# Patient Record
Sex: Male | Born: 1975 | Race: White | Hispanic: No | Marital: Single
Health system: Southern US, Community
[De-identification: ages and names within clinical notes are randomized; demographics above are authoritative.]

## PROBLEM LIST (undated history)

## (undated) DIAGNOSIS — Z8669 Personal history of other diseases of the nervous system and sense organs: Secondary | ICD-10-CM

## (undated) DIAGNOSIS — F32A Depression, unspecified: Secondary | ICD-10-CM

## (undated) DIAGNOSIS — F329 Major depressive disorder, single episode, unspecified: Secondary | ICD-10-CM

## (undated) DIAGNOSIS — S069X9A Unspecified intracranial injury with loss of consciousness of unspecified duration, initial encounter: Secondary | ICD-10-CM

---

## 2006-08-07 ENCOUNTER — Ambulatory Visit: Payer: Self-pay | Admitting: Internal Medicine

## 2006-10-24 ENCOUNTER — Ambulatory Visit: Payer: Self-pay | Admitting: Internal Medicine

## 2006-10-30 ENCOUNTER — Encounter: Payer: Self-pay | Admitting: Internal Medicine

## 2006-10-30 ENCOUNTER — Ambulatory Visit: Payer: Self-pay | Admitting: Internal Medicine

## 2007-02-12 ENCOUNTER — Ambulatory Visit: Payer: Self-pay | Admitting: Internal Medicine

## 2007-04-06 ENCOUNTER — Ambulatory Visit: Payer: Self-pay | Admitting: Psychiatry

## 2007-04-06 ENCOUNTER — Emergency Department (HOSPITAL_COMMUNITY): Admission: EM | Admit: 2007-04-06 | Discharge: 2007-04-06 | Payer: Self-pay | Admitting: Emergency Medicine

## 2007-04-06 ENCOUNTER — Inpatient Hospital Stay (HOSPITAL_COMMUNITY): Admission: AD | Admit: 2007-04-06 | Discharge: 2007-04-07 | Payer: Self-pay | Admitting: Psychiatry

## 2007-04-10 ENCOUNTER — Ambulatory Visit: Payer: Self-pay | Admitting: Licensed Clinical Social Worker

## 2007-06-28 DIAGNOSIS — K2940 Chronic atrophic gastritis without bleeding: Secondary | ICD-10-CM | POA: Insufficient documentation

## 2007-06-28 DIAGNOSIS — G43909 Migraine, unspecified, not intractable, without status migrainosus: Secondary | ICD-10-CM | POA: Insufficient documentation

## 2007-06-28 DIAGNOSIS — G4700 Insomnia, unspecified: Secondary | ICD-10-CM

## 2007-06-28 DIAGNOSIS — R1110 Vomiting, unspecified: Secondary | ICD-10-CM | POA: Insufficient documentation
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
## 2011-02-06 LAB — RAPID URINE DRUG SCREEN, HOSP PERFORMED
Cocaine: NOT DETECTED
Tetrahydrocannabinol: NOT DETECTED

## 2011-02-06 LAB — DIFFERENTIAL
Basophils Relative: 0
Lymphs Abs: 1.5
Monocytes Absolute: 0.6
Monocytes Relative: 12
Neutro Abs: 2.6
Neutrophils Relative %: 52

## 2011-02-06 LAB — I-STAT 8, (EC8 V) (CONVERTED LAB)
BUN: 16
Glucose, Bld: 93
Hemoglobin: 13.9
Potassium: 4.2
Sodium: 136
pH, Ven: 7.455 — ABNORMAL HIGH

## 2011-02-06 LAB — CBC
Hemoglobin: 13.2
MCHC: 33.7
MCV: 95.3
RBC: 4.1 — ABNORMAL LOW
WBC: 5

## 2016-02-04 ENCOUNTER — Ambulatory Visit: Payer: Self-pay | Admitting: Family Medicine

## 2017-04-15 ENCOUNTER — Emergency Department (HOSPITAL_COMMUNITY): Payer: Medicaid Other | Admitting: Certified Registered"

## 2017-04-15 ENCOUNTER — Encounter (HOSPITAL_COMMUNITY): Admission: EM | Disposition: A | Discharge status: Rehabilitation Facility | Payer: Self-pay | Source: Home / Self Care

## 2017-04-15 ENCOUNTER — Emergency Department (HOSPITAL_COMMUNITY): Payer: Medicaid Other

## 2017-04-15 ENCOUNTER — Encounter (HOSPITAL_COMMUNITY): Payer: Self-pay | Admitting: *Deleted

## 2017-04-15 ENCOUNTER — Inpatient Hospital Stay (HOSPITAL_COMMUNITY)
Admission: EM | Admit: 2017-04-15 | Discharge: 2017-04-19 | DRG: 025 | Disposition: A | Discharge status: Rehabilitation Facility | Payer: Medicaid Other | Attending: General Surgery | Admitting: General Surgery

## 2017-04-15 DIAGNOSIS — J9600 Acute respiratory failure, unspecified whether with hypoxia or hypercapnia: Secondary | ICD-10-CM

## 2017-04-15 DIAGNOSIS — Z9889 Other specified postprocedural states: Secondary | ICD-10-CM

## 2017-04-15 DIAGNOSIS — Z818 Family history of other mental and behavioral disorders: Secondary | ICD-10-CM

## 2017-04-15 DIAGNOSIS — S064X9A Epidural hemorrhage with loss of consciousness of unspecified duration, initial encounter: Secondary | ICD-10-CM

## 2017-04-15 DIAGNOSIS — S065XAA Traumatic subdural hemorrhage with loss of consciousness status unknown, initial encounter: Secondary | ICD-10-CM | POA: Diagnosis present

## 2017-04-15 DIAGNOSIS — J9690 Respiratory failure, unspecified, unspecified whether with hypoxia or hypercapnia: Secondary | ICD-10-CM

## 2017-04-15 DIAGNOSIS — S0211GA Other fracture of occiput, right side, initial encounter for closed fracture: Secondary | ICD-10-CM | POA: Diagnosis present

## 2017-04-15 DIAGNOSIS — R569 Unspecified convulsions: Secondary | ICD-10-CM

## 2017-04-15 DIAGNOSIS — S0219XA Other fracture of base of skull, initial encounter for closed fracture: Secondary | ICD-10-CM | POA: Diagnosis present

## 2017-04-15 DIAGNOSIS — J9601 Acute respiratory failure with hypoxia: Secondary | ICD-10-CM | POA: Diagnosis present

## 2017-04-15 DIAGNOSIS — S2243XA Multiple fractures of ribs, bilateral, initial encounter for closed fracture: Secondary | ICD-10-CM | POA: Diagnosis present

## 2017-04-15 DIAGNOSIS — S2249XA Multiple fractures of ribs, unspecified side, initial encounter for closed fracture: Secondary | ICD-10-CM

## 2017-04-15 DIAGNOSIS — G40409 Other generalized epilepsy and epileptic syndromes, not intractable, without status epilepticus: Secondary | ICD-10-CM | POA: Diagnosis present

## 2017-04-15 DIAGNOSIS — G4700 Insomnia, unspecified: Secondary | ICD-10-CM | POA: Diagnosis present

## 2017-04-15 DIAGNOSIS — R402412 Glasgow coma scale score 13-15, at arrival to emergency department: Secondary | ICD-10-CM | POA: Diagnosis present

## 2017-04-15 DIAGNOSIS — S066X9A Traumatic subarachnoid hemorrhage with loss of consciousness of unspecified duration, initial encounter: Secondary | ICD-10-CM | POA: Diagnosis present

## 2017-04-15 DIAGNOSIS — Z87820 Personal history of traumatic brain injury: Secondary | ICD-10-CM

## 2017-04-15 DIAGNOSIS — S064XAA Epidural hemorrhage with loss of consciousness status unknown, initial encounter: Secondary | ICD-10-CM

## 2017-04-15 DIAGNOSIS — G43909 Migraine, unspecified, not intractable, without status migrainosus: Secondary | ICD-10-CM | POA: Diagnosis present

## 2017-04-15 DIAGNOSIS — S065X9A Traumatic subdural hemorrhage with loss of consciousness of unspecified duration, initial encounter: Secondary | ICD-10-CM | POA: Diagnosis present

## 2017-04-15 HISTORY — PX: CRANIOTOMY: SHX93

## 2017-04-15 HISTORY — DX: Unspecified intracranial injury with loss of consciousness of unspecified duration, initial encounter: S06.9X9A

## 2017-04-15 HISTORY — DX: Depression, unspecified: F32.A

## 2017-04-15 HISTORY — DX: Personal history of other diseases of the nervous system and sense organs: Z86.69

## 2017-04-15 HISTORY — DX: Major depressive disorder, single episode, unspecified: F32.9

## 2017-04-15 LAB — I-STAT ARTERIAL BLOOD GAS, ED
Acid-base deficit: 4 mmol/L — ABNORMAL HIGH (ref 0.0–2.0)
Bicarbonate: 22.4 mmol/L (ref 20.0–28.0)
O2 Saturation: 100 %
PCO2 ART: 44 mmHg (ref 32.0–48.0)
PH ART: 7.313 — AB (ref 7.350–7.450)
TCO2: 24 mmol/L (ref 22–32)
pO2, Arterial: 297 mmHg — ABNORMAL HIGH (ref 83.0–108.0)

## 2017-04-15 LAB — CBC
HEMATOCRIT: 39 % (ref 39.0–52.0)
Hemoglobin: 13.2 g/dL (ref 13.0–17.0)
MCH: 31.4 pg (ref 26.0–34.0)
MCHC: 33.8 g/dL (ref 30.0–36.0)
MCV: 92.6 fL (ref 78.0–100.0)
PLATELETS: 190 10*3/uL (ref 150–400)
RBC: 4.21 MIL/uL — ABNORMAL LOW (ref 4.22–5.81)
RDW: 12.9 % (ref 11.5–15.5)
WBC: 6.9 10*3/uL (ref 4.0–10.5)

## 2017-04-15 LAB — COMPREHENSIVE METABOLIC PANEL
ALT: 19 U/L (ref 17–63)
AST: 28 U/L (ref 15–41)
Albumin: 4.4 g/dL (ref 3.5–5.0)
Alkaline Phosphatase: 88 U/L (ref 38–126)
Anion gap: 9 (ref 5–15)
BILIRUBIN TOTAL: 0.6 mg/dL (ref 0.3–1.2)
BUN: 10 mg/dL (ref 6–20)
CO2: 28 mmol/L (ref 22–32)
CREATININE: 1.16 mg/dL (ref 0.61–1.24)
Calcium: 10 mg/dL (ref 8.9–10.3)
Chloride: 103 mmol/L (ref 101–111)
GFR calc Af Amer: >60 mL/min (ref >60)
Glucose, Bld: 109 mg/dL — ABNORMAL HIGH (ref 65–99)
POTASSIUM: 2.8 mmol/L — AB (ref 3.5–5.1)
Sodium: 140 mmol/L (ref 135–145)
TOTAL PROTEIN: 7 g/dL (ref 6.5–8.1)

## 2017-04-15 LAB — PREPARE RBC (CROSSMATCH)

## 2017-04-15 LAB — I-STAT CHEM 8, ED
BUN: 12 mg/dL (ref 6–20)
CREATININE: 1.1 mg/dL (ref 0.61–1.24)
Calcium, Ion: 1.25 mmol/L (ref 1.15–1.40)
Chloride: 101 mmol/L (ref 101–111)
Glucose, Bld: 110 mg/dL — ABNORMAL HIGH (ref 65–99)
HEMATOCRIT: 40 % (ref 39.0–52.0)
Hemoglobin: 13.6 g/dL (ref 13.0–17.0)
Potassium: 2.8 mmol/L — ABNORMAL LOW (ref 3.5–5.1)
Sodium: 143 mmol/L (ref 135–145)
TCO2: 28 mmol/L (ref 22–32)

## 2017-04-15 LAB — I-STAT CG4 LACTIC ACID, ED: Lactic Acid, Venous: 1.62 mmol/L (ref 0.5–1.9)

## 2017-04-15 LAB — ETHANOL

## 2017-04-15 LAB — PROTIME-INR
INR: 0.93
Prothrombin Time: 12.4 seconds (ref 11.4–15.2)

## 2017-04-15 SURGERY — CRANIOTOMY HEMATOMA EVACUATION SUBDURAL
Anesthesia: General | Site: Head | Laterality: Right

## 2017-04-15 MED ORDER — FENTANYL CITRATE (PF) 100 MCG/2ML IJ SOLN
INTRAMUSCULAR | Status: AC
  Filled 2017-04-15: qty 2

## 2017-04-15 MED ORDER — ROCURONIUM BROMIDE 50 MG/5ML IV SOLN
70.0000 mg | Freq: Once | INTRAVENOUS | Status: AC
  Administered 2017-04-15: 70 mg via INTRAVENOUS
  Filled 2017-04-15: qty 7

## 2017-04-15 MED ORDER — PROPOFOL 1000 MG/100ML IV EMUL
5.0000 ug/kg/min | Freq: Once | INTRAVENOUS | Status: AC
  Administered 2017-04-15: 25 ug/kg/min via INTRAVENOUS
  Filled 2017-04-15: qty 100

## 2017-04-15 MED ORDER — THROMBIN (RECOMBINANT) 20000 UNITS EX SOLR
CUTANEOUS | Status: AC
  Filled 2017-04-15: qty 20000

## 2017-04-15 MED ORDER — THROMBIN (RECOMBINANT) 5000 UNITS EX SOLR
CUTANEOUS | Status: AC
  Filled 2017-04-15: qty 5000

## 2017-04-15 MED ORDER — IOPAMIDOL (ISOVUE-300) INJECTION 61%
INTRAVENOUS | Status: AC
  Administered 2017-04-15: 100 mL
  Filled 2017-04-15: qty 100

## 2017-04-15 MED ORDER — FENTANYL CITRATE (PF) 100 MCG/2ML IJ SOLN
INTRAMUSCULAR | Status: AC
  Administered 2017-04-15: 100 ug via INTRAVENOUS
  Filled 2017-04-15: qty 2

## 2017-04-15 MED ORDER — SODIUM CHLORIDE 0.9 % IV SOLN
10.0000 mL/h | Freq: Once | INTRAVENOUS | Status: AC
  Administered 2017-04-15: 23:00:00 via INTRAVENOUS

## 2017-04-15 MED ORDER — TETANUS-DIPHTH-ACELL PERTUSSIS 5-2.5-18.5 LF-MCG/0.5 IM SUSP
INTRAMUSCULAR | Status: AC
  Filled 2017-04-15: qty 0.5

## 2017-04-15 MED ORDER — LORAZEPAM 2 MG/ML IJ SOLN
INTRAMUSCULAR | Status: AC
  Filled 2017-04-15: qty 1

## 2017-04-15 MED ORDER — BACITRACIN ZINC 500 UNIT/GM EX OINT
TOPICAL_OINTMENT | CUTANEOUS | Status: AC
  Filled 2017-04-15: qty 28.35

## 2017-04-15 MED ORDER — PROPOFOL 10 MG/ML IV BOLUS
INTRAVENOUS | Status: AC
  Filled 2017-04-15: qty 20

## 2017-04-15 MED ORDER — SODIUM CHLORIDE 0.9 % IV SOLN
INTRAVENOUS | Status: AC | PRN
  Administered 2017-04-15: 100 mL/h via INTRAVENOUS

## 2017-04-15 MED ORDER — MIDAZOLAM HCL 2 MG/2ML IJ SOLN
INTRAMUSCULAR | Status: AC
  Administered 2017-04-15
  Filled 2017-04-15: qty 2

## 2017-04-15 MED ORDER — SODIUM CHLORIDE 0.9 % IV SOLN
1500.0000 mg | Freq: Once | INTRAVENOUS | Status: AC
  Administered 2017-04-15: 1500 mg via INTRAVENOUS
  Filled 2017-04-15: qty 15

## 2017-04-15 MED ORDER — MIDAZOLAM HCL 2 MG/2ML IJ SOLN
2.0000 mg | Freq: Once | INTRAMUSCULAR | Status: AC
  Administered 2017-04-15: 2 mg via INTRAVENOUS

## 2017-04-15 MED ORDER — FENTANYL CITRATE (PF) 250 MCG/5ML IJ SOLN
INTRAMUSCULAR | Status: AC
  Filled 2017-04-15: qty 5

## 2017-04-15 MED ORDER — LORAZEPAM 2 MG/ML IJ SOLN
2.0000 mg | Freq: Once | INTRAMUSCULAR | Status: AC
  Administered 2017-04-15: 2 mg via INTRAVENOUS

## 2017-04-15 MED ORDER — LIDOCAINE-EPINEPHRINE 1 %-1:100000 IJ SOLN
INTRAMUSCULAR | Status: AC
  Filled 2017-04-15: qty 1

## 2017-04-15 MED ORDER — PROPOFOL 1000 MG/100ML IV EMUL
INTRAVENOUS | Status: AC
  Filled 2017-04-15: qty 100

## 2017-04-15 MED ORDER — ETOMIDATE 2 MG/ML IV SOLN
20.0000 mg | Freq: Once | INTRAVENOUS | Status: AC
  Administered 2017-04-15: 20 mg via INTRAVENOUS

## 2017-04-15 MED ORDER — FENTANYL CITRATE (PF) 100 MCG/2ML IJ SOLN
100.0000 ug | Freq: Once | INTRAMUSCULAR | Status: AC
  Administered 2017-04-15: 100 ug via INTRAVENOUS

## 2017-04-15 MED ORDER — MIDAZOLAM HCL 2 MG/2ML IJ SOLN
INTRAMUSCULAR | Status: AC
  Filled 2017-04-15: qty 2

## 2017-04-15 MED ORDER — TETANUS-DIPHTH-ACELL PERTUSSIS 5-2.5-18.5 LF-MCG/0.5 IM SUSP
0.5000 mL | Freq: Once | INTRAMUSCULAR | Status: AC
  Administered 2017-04-15: 0.5 mL via INTRAMUSCULAR

## 2017-04-15 SURGICAL SUPPLY — 62 items
BAG DECANTER FOR FLEXI CONT (MISCELLANEOUS) ×3 IMPLANT
BUR SPIRAL ROUTER 2.3 (BUR) ×2 IMPLANT
BUR SPIRAL ROUTER 2.3MM (BUR) ×1
CANISTER SUCT 3000ML PPV (MISCELLANEOUS) ×6 IMPLANT
CARTRIDGE OIL MAESTRO DRILL (MISCELLANEOUS) ×1 IMPLANT
CLIP VESOCCLUDE MED 6/CT (CLIP) IMPLANT
DIFFUSER DRILL AIR PNEUMATIC (MISCELLANEOUS) ×3 IMPLANT
DRAPE MICROSCOPE LEICA (MISCELLANEOUS) IMPLANT
DRAPE NEUROLOGICAL W/INCISE (DRAPES) ×3 IMPLANT
DRAPE SURG 17X23 STRL (DRAPES) IMPLANT
DRAPE WARM FLUID 44X44 (DRAPE) ×3 IMPLANT
DURAPREP 26ML APPLICATOR (WOUND CARE) ×3 IMPLANT
DURAPREP 6ML APPLICATOR 50/CS (WOUND CARE) ×3 IMPLANT
ELECT CAUTERY BLADE 6.4 (BLADE) IMPLANT
ELECT REM PT RETURN 9FT ADLT (ELECTROSURGICAL) ×3
ELECTRODE REM PT RTRN 9FT ADLT (ELECTROSURGICAL) ×1 IMPLANT
EVACUATOR 1/8 PVC DRAIN (DRAIN) IMPLANT
GAUZE SPONGE 4X4 12PLY STRL (GAUZE/BANDAGES/DRESSINGS) ×3 IMPLANT
GAUZE SPONGE 4X4 12PLY STRL LF (GAUZE/BANDAGES/DRESSINGS) ×3 IMPLANT
GAUZE SPONGE 4X4 16PLY XRAY LF (GAUZE/BANDAGES/DRESSINGS) IMPLANT
GLOVE BIO SURGEON STRL SZ7 (GLOVE) ×3 IMPLANT
GLOVE BIO SURGEON STRL SZ8 (GLOVE) ×3 IMPLANT
GLOVE BIOGEL PI IND STRL 7.0 (GLOVE) ×2 IMPLANT
GLOVE BIOGEL PI INDICATOR 7.0 (GLOVE) ×4
GLOVE SURG SS PI 6.5 STRL IVOR (GLOVE) ×9 IMPLANT
GOWN STRL REUS W/ TWL LRG LVL3 (GOWN DISPOSABLE) ×2 IMPLANT
GOWN STRL REUS W/ TWL XL LVL3 (GOWN DISPOSABLE) ×1 IMPLANT
GOWN STRL REUS W/TWL 2XL LVL3 (GOWN DISPOSABLE) ×3 IMPLANT
GOWN STRL REUS W/TWL LRG LVL3 (GOWN DISPOSABLE) ×4
GOWN STRL REUS W/TWL XL LVL3 (GOWN DISPOSABLE) ×2
HEMOSTAT POWDER KIT SURGIFOAM (HEMOSTASIS) ×3 IMPLANT
KIT BASIN OR (CUSTOM PROCEDURE TRAY) ×3 IMPLANT
KIT ROOM TURNOVER OR (KITS) ×3 IMPLANT
NEEDLE HYPO 22GX1.5 SAFETY (NEEDLE) ×3 IMPLANT
NS IRRIG 1000ML POUR BTL (IV SOLUTION) ×6 IMPLANT
OIL CARTRIDGE MAESTRO DRILL (MISCELLANEOUS) ×3
PACK CRANIOTOMY (CUSTOM PROCEDURE TRAY) ×3 IMPLANT
PAD ARMBOARD 7.5X6 YLW CONV (MISCELLANEOUS) ×6 IMPLANT
PATTIES SURGICAL .25X.25 (GAUZE/BANDAGES/DRESSINGS) IMPLANT
PATTIES SURGICAL .5 X.5 (GAUZE/BANDAGES/DRESSINGS) IMPLANT
PATTIES SURGICAL .5 X3 (DISPOSABLE) IMPLANT
PATTIES SURGICAL 1X1 (DISPOSABLE) IMPLANT
PERFORATOR LRG  14-11MM (BIT) ×2
PERFORATOR LRG 14-11MM (BIT) ×1 IMPLANT
PIN MAYFIELD SKULL DISP (PIN) ×3 IMPLANT
PLATE 1.5  2HOLE LNG NEURO (Plate) ×8 IMPLANT
PLATE 1.5 2HOLE LNG NEURO (Plate) ×4 IMPLANT
RUBBERBAND STERILE (MISCELLANEOUS) IMPLANT
SCREW SELF DRILL HT 1.5/4MM (Screw) ×24 IMPLANT
SPONGE NEURO XRAY DETECT 1X3 (DISPOSABLE) IMPLANT
SPONGE SURGIFOAM ABS GEL 100 (HEMOSTASIS) ×3 IMPLANT
STAPLER VISISTAT 35W (STAPLE) ×3 IMPLANT
SUT ETHILON 3 0 FSL (SUTURE) IMPLANT
SUT NURALON 4 0 TR CR/8 (SUTURE) ×6 IMPLANT
SUT VIC AB 2-0 CP2 18 (SUTURE) ×6 IMPLANT
SYR CONTROL 10ML LL (SYRINGE) IMPLANT
TAPE CLOTH SURG 4X10 WHT LF (GAUZE/BANDAGES/DRESSINGS) ×3 IMPLANT
TOWEL GREEN STERILE (TOWEL DISPOSABLE) ×3 IMPLANT
TOWEL GREEN STERILE FF (TOWEL DISPOSABLE) ×3 IMPLANT
TRAY FOLEY W/METER SILVER 16FR (SET/KITS/TRAYS/PACK) IMPLANT
UNDERPAD 30X30 (UNDERPADS AND DIAPERS) IMPLANT
WATER STERILE IRR 1000ML POUR (IV SOLUTION) ×3 IMPLANT
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
## 2017-04-15 NOTE — H&P (Signed)
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 penis normal.  Musculoskeletal: He exhibits no edema or deformity.  Neurological: He is unresponsive. He exhibits normal muscle tone.  No posturing  Skin: Skin is warm and dry.  Psychiatric:  Unable to examine due to intubation/sedation     Assessment/Injury Summary: -Right temporal epidural hematoma with midline shift 40m -Complex right temporal bone fx -Small amount of SAH - skullbase -Small amount of hemorrhage in L frontal and temporal lobes -Right occipital bone fx  PLAN -Taken to OR emergently by neurosurgery -ICU as per neurosurgery -Will ultimately need ENT evaluation given the complex temporal bone fx -Pulmonary toilet for aspiration  CSharon Mt WDema Severin M.D. [facility] 04/16/2017, 1:04 AM
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
## 2017-04-15 NOTE — Anesthesia Preprocedure Evaluation (Signed)
Anesthesia Evaluation  Patient identified by MRN, date of birth, ID band Patient unresponsive  Preop documentation limited or incomplete due to emergent nature of procedure.  Airway Mallampati: Intubated       Dental   Pulmonary    Pulmonary exam normal        Cardiovascular Normal cardiovascular exam     Neuro/Psych Subdural Hematoma    GI/Hepatic   Endo/Other    Renal/GU      Musculoskeletal   Abdominal   Peds  Hematology   Anesthesia Other Findings   Reproductive/Obstetrics                             Anesthesia Physical Anesthesia Plan  ASA: IV and emergent  Anesthesia Plan: General   Post-op Pain Management:    Induction: Intravenous  PONV Risk Score and Plan: 2 and Treatment may vary due to age or medical condition  Airway Management Planned: Oral ETT  Additional Equipment:   Intra-op Plan:   Post-operative Plan: Post-operative intubation/ventilation  Informed Consent: I have reviewed the patients History and Physical, chart, labs and discussed the procedure including the risks, benefits and alternatives for the proposed anesthesia with the patient or authorized representative who has indicated his/her understanding and acceptance.     Plan Discussed with: CRNA and Surgeon  Anesthesia Plan Comments:         Anesthesia Quick Evaluation

## 2017-04-16 ENCOUNTER — Inpatient Hospital Stay (HOSPITAL_COMMUNITY): Payer: Medicaid Other

## 2017-04-16 ENCOUNTER — Other Ambulatory Visit: Payer: Self-pay

## 2017-04-16 ENCOUNTER — Encounter (HOSPITAL_COMMUNITY): Payer: Self-pay | Admitting: Anesthesiology

## 2017-04-16 DIAGNOSIS — S2243XA Multiple fractures of ribs, bilateral, initial encounter for closed fracture: Secondary | ICD-10-CM | POA: Diagnosis present

## 2017-04-16 DIAGNOSIS — Z87820 Personal history of traumatic brain injury: Secondary | ICD-10-CM | POA: Diagnosis not present

## 2017-04-16 DIAGNOSIS — R402412 Glasgow coma scale score 13-15, at arrival to emergency department: Secondary | ICD-10-CM | POA: Diagnosis present

## 2017-04-16 DIAGNOSIS — G4700 Insomnia, unspecified: Secondary | ICD-10-CM | POA: Diagnosis present

## 2017-04-16 DIAGNOSIS — S064X9A Epidural hemorrhage with loss of consciousness of unspecified duration, initial encounter: Secondary | ICD-10-CM | POA: Diagnosis not present

## 2017-04-16 DIAGNOSIS — J9601 Acute respiratory failure with hypoxia: Secondary | ICD-10-CM | POA: Diagnosis present

## 2017-04-16 DIAGNOSIS — Z9889 Other specified postprocedural states: Secondary | ICD-10-CM

## 2017-04-16 DIAGNOSIS — S0211GA Other fracture of occiput, right side, initial encounter for closed fracture: Secondary | ICD-10-CM | POA: Diagnosis present

## 2017-04-16 DIAGNOSIS — S064X9D Epidural hemorrhage with loss of consciousness of unspecified duration, subsequent encounter: Secondary | ICD-10-CM | POA: Diagnosis not present

## 2017-04-16 DIAGNOSIS — S065X9A Traumatic subdural hemorrhage with loss of consciousness of unspecified duration, initial encounter: Secondary | ICD-10-CM | POA: Diagnosis present

## 2017-04-16 DIAGNOSIS — G43909 Migraine, unspecified, not intractable, without status migrainosus: Secondary | ICD-10-CM | POA: Diagnosis present

## 2017-04-16 DIAGNOSIS — Z818 Family history of other mental and behavioral disorders: Secondary | ICD-10-CM | POA: Diagnosis not present

## 2017-04-16 DIAGNOSIS — S065XAA Traumatic subdural hemorrhage with loss of consciousness status unknown, initial encounter: Secondary | ICD-10-CM | POA: Diagnosis present

## 2017-04-16 DIAGNOSIS — R569 Unspecified convulsions: Secondary | ICD-10-CM | POA: Diagnosis present

## 2017-04-16 DIAGNOSIS — S0219XA Other fracture of base of skull, initial encounter for closed fracture: Secondary | ICD-10-CM | POA: Diagnosis present

## 2017-04-16 DIAGNOSIS — S066X9A Traumatic subarachnoid hemorrhage with loss of consciousness of unspecified duration, initial encounter: Secondary | ICD-10-CM | POA: Diagnosis present

## 2017-04-16 DIAGNOSIS — G40409 Other generalized epilepsy and epileptic syndromes, not intractable, without status epilepticus: Secondary | ICD-10-CM | POA: Diagnosis present

## 2017-04-16 LAB — BLOOD GAS, ARTERIAL
Acid-Base Excess: 1.8 mmol/L (ref 0.0–2.0)
Bicarbonate: 25.1 mmol/L (ref 20.0–28.0)
DRAWN BY: 252031
FIO2: 40
O2 SAT: 98.6 %
PATIENT TEMPERATURE: 98.6
PCO2 ART: 34.1 mmHg (ref 32.0–48.0)
PEEP/CPAP: 5 cmH2O
PH ART: 7.479 — AB (ref 7.350–7.450)
RATE: 18 resp/min
VT: 600 mL
pO2, Arterial: 142 mmHg — ABNORMAL HIGH (ref 83.0–108.0)

## 2017-04-16 LAB — POCT I-STAT 7, (LYTES, BLD GAS, ICA,H+H)
Acid-Base Excess: 1 mmol/L (ref 0.0–2.0)
BICARBONATE: 27.2 mmol/L (ref 20.0–28.0)
Calcium, Ion: 1.25 mmol/L (ref 1.15–1.40)
HCT: 36 % — ABNORMAL LOW (ref 39.0–52.0)
Hemoglobin: 12.2 g/dL — ABNORMAL LOW (ref 13.0–17.0)
O2 Saturation: 100 %
PCO2 ART: 50.5 mmHg — AB (ref 32.0–48.0)
Potassium: 2.9 mmol/L — ABNORMAL LOW (ref 3.5–5.1)
Sodium: 140 mmol/L (ref 135–145)
TCO2: 29 mmol/L (ref 22–32)
pH, Arterial: 7.34 — ABNORMAL LOW (ref 7.350–7.450)
pO2, Arterial: 322 mmHg — ABNORMAL HIGH (ref 83.0–108.0)

## 2017-04-16 LAB — CBC
HEMATOCRIT: 34.8 % — AB (ref 39.0–52.0)
HEMOGLOBIN: 12 g/dL — AB (ref 13.0–17.0)
MCH: 31.8 pg (ref 26.0–34.0)
MCHC: 34.5 g/dL (ref 30.0–36.0)
MCV: 92.3 fL (ref 78.0–100.0)
Platelets: 180 10*3/uL (ref 150–400)
RBC: 3.77 MIL/uL — ABNORMAL LOW (ref 4.22–5.81)
RDW: 13 % (ref 11.5–15.5)
WBC: 12.5 10*3/uL — AB (ref 4.0–10.5)

## 2017-04-16 LAB — BPAM FFP
BLOOD PRODUCT EXPIRATION DATE: 201812202359
Blood Product Expiration Date: 201812202359
ISSUE DATE / TIME: 201812162206
ISSUE DATE / TIME: 201812162206
UNIT TYPE AND RH: 600
UNIT TYPE AND RH: 6200

## 2017-04-16 LAB — COMPREHENSIVE METABOLIC PANEL
ALBUMIN: 3.7 g/dL (ref 3.5–5.0)
ALK PHOS: 75 U/L (ref 38–126)
ALT: 20 U/L (ref 17–63)
AST: 31 U/L (ref 15–41)
Anion gap: 8 (ref 5–15)
BILIRUBIN TOTAL: 0.7 mg/dL (ref 0.3–1.2)
BUN: 9 mg/dL (ref 6–20)
CALCIUM: 9 mg/dL (ref 8.9–10.3)
CO2: 25 mmol/L (ref 22–32)
Chloride: 105 mmol/L (ref 101–111)
Creatinine, Ser: 1.01 mg/dL (ref 0.61–1.24)
GFR calc Af Amer: >60 mL/min (ref >60)
GFR calc non Af Amer: >60 mL/min (ref >60)
GLUCOSE: 122 mg/dL — AB (ref 65–99)
Potassium: 3.5 mmol/L (ref 3.5–5.1)
Sodium: 138 mmol/L (ref 135–145)
TOTAL PROTEIN: 5.9 g/dL — AB (ref 6.5–8.1)

## 2017-04-16 LAB — URINALYSIS, ROUTINE W REFLEX MICROSCOPIC
Bacteria, UA: NONE SEEN
Bilirubin Urine: NEGATIVE
GLUCOSE, UA: NEGATIVE mg/dL
Hgb urine dipstick: NEGATIVE
Ketones, ur: NEGATIVE mg/dL
Leukocytes, UA: NEGATIVE
NITRITE: NEGATIVE
PH: 5 (ref 5.0–8.0)
Protein, ur: 30 mg/dL — AB
Squamous Epithelial / LPF: NONE SEEN

## 2017-04-16 LAB — PREPARE FRESH FROZEN PLASMA
UNIT DIVISION: 0
Unit division: 0

## 2017-04-16 LAB — MRSA PCR SCREENING: MRSA by PCR: NEGATIVE

## 2017-04-16 LAB — BLOOD PRODUCT ORDER (VERBAL) VERIFICATION

## 2017-04-16 LAB — ABO/RH: ABO/RH(D): O POS

## 2017-04-16 LAB — HIV ANTIBODY (ROUTINE TESTING W REFLEX): HIV SCREEN 4TH GENERATION: NONREACTIVE

## 2017-04-16 LAB — CDS SEROLOGY

## 2017-04-16 LAB — TRIGLYCERIDES: Triglycerides: 51 mg/dL (ref <150)

## 2017-04-16 LAB — GLUCOSE, CAPILLARY: Glucose-Capillary: 100 mg/dL — ABNORMAL HIGH (ref 65–99)

## 2017-04-16 MED ORDER — CIPROFLOXACIN-DEXAMETHASONE 0.3-0.1 % OT SUSP
4.0000 [drp] | Freq: Two times a day (BID) | OTIC | Status: DC
  Administered 2017-04-16 – 2017-04-19 (×6): 4 [drp] via OTIC
  Filled 2017-04-16 (×2): qty 7.5

## 2017-04-16 MED ORDER — CEFAZOLIN SODIUM-DEXTROSE 1-4 GM/50ML-% IV SOLN
1.0000 g | Freq: Three times a day (TID) | INTRAVENOUS | Status: AC
  Administered 2017-04-16 (×2): 1 g via INTRAVENOUS
  Filled 2017-04-16 (×2): qty 50

## 2017-04-16 MED ORDER — PANTOPRAZOLE SODIUM 40 MG IV SOLR
40.0000 mg | Freq: Every day | INTRAVENOUS | Status: DC
  Administered 2017-04-16 – 2017-04-18 (×3): 40 mg via INTRAVENOUS
  Filled 2017-04-16 (×4): qty 40

## 2017-04-16 MED ORDER — ACETAMINOPHEN 650 MG RE SUPP
650.0000 mg | RECTAL | Status: DC | PRN
Start: 2017-04-16 — End: 2017-04-19
  Administered 2017-04-16 – 2017-04-17 (×3): 650 mg via RECTAL
  Filled 2017-04-16 (×3): qty 1

## 2017-04-16 MED ORDER — BACITRACIN 50000 UNITS IM SOLR
INTRAMUSCULAR | Status: DC | PRN
  Administered 2017-04-16: 500 mL

## 2017-04-16 MED ORDER — ONDANSETRON HCL 4 MG/2ML IJ SOLN: 4.0000 mg | Freq: Four times a day (QID) | INTRAMUSCULAR | Status: DC | PRN

## 2017-04-16 MED ORDER — ONDANSETRON 4 MG PO TBDP: 4.0000 mg | ORAL_TABLET | Freq: Four times a day (QID) | ORAL | Status: DC | PRN

## 2017-04-16 MED ORDER — PANTOPRAZOLE SODIUM 40 MG PO TBEC
40.0000 mg | DELAYED_RELEASE_TABLET | Freq: Every day | ORAL | Status: DC
  Administered 2017-04-19: 40 mg via ORAL
  Filled 2017-04-16: qty 1

## 2017-04-16 MED ORDER — PROPOFOL 500 MG/50ML IV EMUL
INTRAVENOUS | Status: DC | PRN
  Administered 2017-04-16: 75 ug/kg/min via INTRAVENOUS

## 2017-04-16 MED ORDER — DEXTROSE 5 % IV SOLN
INTRAVENOUS | Status: DC | PRN
  Administered 2017-04-16: 50 ug/min via INTRAVENOUS

## 2017-04-16 MED ORDER — LACTATED RINGERS IV SOLN
INTRAVENOUS | Status: DC
  Administered 2017-04-16 (×2): via INTRAVENOUS

## 2017-04-16 MED ORDER — LEVETIRACETAM 500 MG/5ML IV SOLN
500.0000 mg | Freq: Two times a day (BID) | INTRAVENOUS | Status: DC
  Administered 2017-04-16 – 2017-04-19 (×7): 500 mg via INTRAVENOUS
  Filled 2017-04-16 (×8): qty 5

## 2017-04-16 MED ORDER — GELATIN ABSORBABLE MT POWD
OROMUCOSAL | Status: DC | PRN
  Administered 2017-04-16 (×2): 5 mL via TOPICAL

## 2017-04-16 MED ORDER — HYDROCODONE-ACETAMINOPHEN 5-325 MG PO TABS
1.0000 | ORAL_TABLET | ORAL | Status: DC | PRN
  Administered 2017-04-18 – 2017-04-19 (×2): 1 via ORAL
  Filled 2017-04-16 (×2): qty 1

## 2017-04-16 MED ORDER — SENNA 8.6 MG PO TABS
1.0000 | ORAL_TABLET | Freq: Two times a day (BID) | ORAL | Status: DC
  Administered 2017-04-17 – 2017-04-19 (×2): 8.6 mg via ORAL
  Filled 2017-04-16 (×3): qty 1

## 2017-04-16 MED ORDER — ACETAMINOPHEN 325 MG PO TABS: 650.0000 mg | ORAL_TABLET | ORAL | Status: DC | PRN

## 2017-04-16 MED ORDER — PROPOFOL 10 MG/ML IV BOLUS
INTRAVENOUS | Status: DC | PRN
  Administered 2017-04-16: 140 mg via INTRAVENOUS

## 2017-04-16 MED ORDER — CEFAZOLIN SODIUM 1 G IJ SOLR
INTRAMUSCULAR | Status: AC
  Filled 2017-04-16: qty 20

## 2017-04-16 MED ORDER — DEXMEDETOMIDINE HCL IN NACL 200 MCG/50ML IV SOLN
0.4000 ug/kg/h | INTRAVENOUS | Status: AC
  Administered 2017-04-16: 1 ug/kg/h via INTRAVENOUS
  Administered 2017-04-16: 1.2 ug/kg/h via INTRAVENOUS
  Administered 2017-04-16: 0.4 ug/kg/h via INTRAVENOUS
  Filled 2017-04-16 (×4): qty 50

## 2017-04-16 MED ORDER — CHLORHEXIDINE GLUCONATE 0.12% ORAL RINSE (MEDLINE KIT)
15.0000 mL | Freq: Two times a day (BID) | OROMUCOSAL | Status: DC
  Administered 2017-04-16 (×2): 15 mL via OROMUCOSAL

## 2017-04-16 MED ORDER — LACTATED RINGERS IV SOLN
INTRAVENOUS | Status: DC | PRN
  Administered 2017-04-16: via INTRAVENOUS

## 2017-04-16 MED ORDER — FENTANYL CITRATE (PF) 250 MCG/5ML IJ SOLN
INTRAMUSCULAR | Status: AC
  Filled 2017-04-16: qty 5

## 2017-04-16 MED ORDER — PROPOFOL 1000 MG/100ML IV EMUL
5.0000 ug/kg/min | INTRAVENOUS | Status: DC
  Administered 2017-04-16: 65 ug/kg/min via INTRAVENOUS
  Administered 2017-04-16: 75 ug/kg/min via INTRAVENOUS
  Administered 2017-04-16: 80 ug/kg/min via INTRAVENOUS
  Administered 2017-04-16: 75 ug/kg/min via INTRAVENOUS
  Filled 2017-04-16 (×2): qty 100

## 2017-04-16 MED ORDER — ORAL CARE MOUTH RINSE: 15.0000 mL | Freq: Four times a day (QID) | OROMUCOSAL | Status: DC

## 2017-04-16 MED ORDER — FENTANYL CITRATE (PF) 100 MCG/2ML IJ SOLN
50.0000 ug | INTRAMUSCULAR | Status: DC | PRN
  Administered 2017-04-16 – 2017-04-18 (×7): 100 ug via INTRAVENOUS
  Filled 2017-04-16 (×7): qty 2

## 2017-04-16 MED ORDER — LABETALOL HCL 5 MG/ML IV SOLN: 10.0000 mg | INTRAVENOUS | Status: DC | PRN

## 2017-04-16 MED ORDER — BACITRACIN ZINC 500 UNIT/GM EX OINT
TOPICAL_OINTMENT | CUTANEOUS | Status: DC | PRN
  Administered 2017-04-16: 1 via TOPICAL

## 2017-04-16 MED ORDER — SODIUM CHLORIDE 0.9 % IV SOLN
0.4000 ug/kg/h | INTRAVENOUS | Status: DC
  Administered 2017-04-16: 1.2 ug/kg/h via INTRAVENOUS
  Administered 2017-04-17: 1 ug/kg/h via INTRAVENOUS
  Administered 2017-04-17: 0.8 ug/kg/h via INTRAVENOUS
  Administered 2017-04-17 – 2017-04-18 (×3): 1.2 ug/kg/h via INTRAVENOUS
  Administered 2017-04-18: 0.6 ug/kg/h via INTRAVENOUS
  Administered 2017-04-18: 1.2 ug/kg/h via INTRAVENOUS
  Administered 2017-04-18: 0.4 ug/kg/h via INTRAVENOUS
  Filled 2017-04-16 (×9): qty 4

## 2017-04-16 MED ORDER — ONDANSETRON HCL 4 MG PO TABS: 4.0000 mg | ORAL_TABLET | ORAL | Status: DC | PRN

## 2017-04-16 MED ORDER — MORPHINE SULFATE (PF) 4 MG/ML IV SOLN
1.0000 mg | INTRAVENOUS | Status: DC | PRN
  Administered 2017-04-16 – 2017-04-19 (×9): 2 mg via INTRAVENOUS
  Filled 2017-04-16 (×10): qty 1

## 2017-04-16 MED ORDER — ROCURONIUM BROMIDE 100 MG/10ML IV SOLN
INTRAVENOUS | Status: DC | PRN
  Administered 2017-04-16: 30 mg via INTRAVENOUS
  Administered 2017-04-16: 20 mg via INTRAVENOUS
  Administered 2017-04-16: 50 mg via INTRAVENOUS

## 2017-04-16 MED ORDER — FENTANYL CITRATE (PF) 100 MCG/2ML IJ SOLN
INTRAMUSCULAR | Status: DC | PRN
  Administered 2017-04-15: 100 ug via INTRAVENOUS
  Administered 2017-04-16 (×2): 50 ug via INTRAVENOUS
  Administered 2017-04-16: 100 ug via INTRAVENOUS
  Administered 2017-04-16 (×2): 50 ug via INTRAVENOUS

## 2017-04-16 MED ORDER — METOPROLOL TARTRATE 5 MG/5ML IV SOLN: 5.0000 mg | Freq: Four times a day (QID) | INTRAVENOUS | Status: DC | PRN

## 2017-04-16 MED ORDER — PHENYLEPHRINE HCL 10 MG/ML IJ SOLN
INTRAMUSCULAR | Status: DC | PRN
  Administered 2017-04-16: 40 ug via INTRAVENOUS

## 2017-04-16 MED ORDER — 0.9 % SODIUM CHLORIDE (POUR BTL) OPTIME
TOPICAL | Status: DC | PRN
  Administered 2017-04-16: 20000 mL

## 2017-04-16 MED ORDER — ONDANSETRON HCL 4 MG/2ML IJ SOLN: 4.0000 mg | INTRAMUSCULAR | Status: DC | PRN

## 2017-04-16 MED ORDER — ORAL CARE MOUTH RINSE
15.0000 mL | OROMUCOSAL | Status: DC
  Administered 2017-04-16 (×7): 15 mL via OROMUCOSAL

## 2017-04-16 MED ORDER — POTASSIUM CHLORIDE IN NACL 20-0.9 MEQ/L-% IV SOLN
INTRAVENOUS | Status: DC
  Administered 2017-04-16 – 2017-04-17 (×4): via INTRAVENOUS
  Filled 2017-04-16 (×6): qty 1000

## 2017-04-16 MED ORDER — LIDOCAINE-EPINEPHRINE 1 %-1:100000 IJ SOLN
INTRAMUSCULAR | Status: DC | PRN
  Administered 2017-04-16: 7 mL

## 2017-04-16 MED ORDER — CHLORHEXIDINE GLUCONATE 0.12% ORAL RINSE (MEDLINE KIT): 15.0000 mL | Freq: Two times a day (BID) | OROMUCOSAL | Status: DC

## 2017-04-16 MED ORDER — THROMBIN (RECOMBINANT) 20000 UNITS EX SOLR
CUTANEOUS | Status: DC | PRN
  Administered 2017-04-16: 20 mL via TOPICAL

## 2017-04-16 MED ORDER — DOCUSATE SODIUM 100 MG PO CAPS
100.0000 mg | ORAL_CAPSULE | Freq: Two times a day (BID) | ORAL | Status: DC
  Administered 2017-04-17 – 2017-04-19 (×2): 100 mg via ORAL
  Filled 2017-04-16 (×3): qty 1

## 2017-04-16 MED ORDER — PANTOPRAZOLE SODIUM 40 MG IV SOLR: 40.0000 mg | Freq: Every day | INTRAVENOUS | Status: DC

## 2017-04-16 MED ORDER — THROMBIN (RECOMBINANT) 5000 UNITS EX SOLR
CUTANEOUS | Status: AC
  Filled 2017-04-16: qty 5000

## 2017-04-16 MED ORDER — POTASSIUM CHLORIDE 10 MEQ/100ML IV SOLN
10.0000 meq | INTRAVENOUS | Status: AC
  Administered 2017-04-16 (×3): 10 meq via INTRAVENOUS
  Filled 2017-04-16 (×3): qty 100

## 2017-04-16 MED ORDER — CEFAZOLIN SODIUM-DEXTROSE 2-3 GM-%(50ML) IV SOLR
INTRAVENOUS | Status: DC | PRN
  Administered 2017-04-16: 2 g via INTRAVENOUS

## 2017-04-16 NOTE — ED Notes (Signed)
Patient became agitated and tried to come up off stretcher.  VO from Dr Cliffton AstersWhite for 2mg  Versed and 100mcg of fentanyl.
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
## 2017-04-16 NOTE — Progress Notes (Signed)
OT Cancellation Note  Patient Details Name: Thomas BlakeShawn Mcfarland MRN: [id number] DOB: 10/23/1975   Cancelled Treatment:    Reason Eval/Treat Not Completed: Patient not medically ready. Pt with active bedrest orders. Will await increase in activity orders prior to initiating OT evaluation. Thank you for this referral!  Doristine Sectionharity A Hillis Mcphatter, MS OTR/L  Pager: [phone]   Spring[facility] Surgery CenterCharity A Kham Zuckerman 04/16/2017, 6:48 AM

## 2017-04-16 NOTE — Progress Notes (Signed)
PT Cancellation Note  Patient Details Name: Thomas Mcfarland MRN: [id number] DOB: Jun 04, 1975   Cancelled Treatment:    Reason Eval/Treat Not Completed: Medical issues which prohibited therapy(pt currently on bedrest )   Thomas Mcfarland 04/16/2017, 7:25 AM  Thomas Mcfarland, PT [phone]
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
## 2017-04-16 NOTE — Anesthesia Procedure Notes (Signed)
Arterial Line Insertion Start/End12/17/2018 12:07 AM, 04/16/2017 12:09 AM Performed by: Arta Brucessey, Kevin, MD, Maness, Howie Illarrie B, CRNA, CRNA  Patient location: OR. Preanesthetic checklist: patient identified, IV checked, pre-op evaluation and timeout performed Left, radial was placed Catheter size: 20 G Maximum sterile barriers used   Attempts: 1 Procedure performed without using ultrasound guided technique. Ultrasound Notes:anatomy identified Following insertion, dressing applied and Biopatch.

## 2017-04-16 NOTE — Anesthesia Postprocedure Evaluation (Signed)
Anesthesia Post Note  Patient: Thomas BlakeShawn Mcfarland  Procedure(s) Performed: CRANIOTOMY HEMATOMA EVACUATION SUBDURAL (Right Head)     Patient location during evaluation: SICU Anesthesia Type: General Level of consciousness: sedated Pain management: pain level controlled Vital Signs Assessment: post-procedure vital signs reviewed and stable Respiratory status: patient remains intubated per anesthesia plan Cardiovascular status: stable Postop Assessment: no apparent nausea or vomiting Anesthetic complications: no    Last Vitals:  Vitals:   04/15/17 2335 04/16/17 0205  BP: (!) 126/91   Pulse: 83   Resp: 18   Temp:    SpO2: 100% 99%    Last Pain:  Vitals:   04/15/17 2202  TempSrc: Temporal                 Lucillia Corson DAVID

## 2017-04-16 NOTE — ED Notes (Signed)
To CT with RN, RT and MD. 
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
## 2017-04-16 NOTE — Progress Notes (Signed)
Pt transported to and from 4N26 to CT1 on ventilator. Pt stable throughout with no complications. VS within normal limits.
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
## 2017-04-16 NOTE — Procedures (Signed)
Extubation Procedure Note  Patient Details:   Name: Thomas BlakeShawn Cali DOB: 04/18/1976 MRN: [id number]   Airway Documentation:     Evaluation  O2 sats: stable throughout Complications: No apparent complications Patient did tolerate procedure well. Bilateral Breath Sounds: Rhonchi, Diminished   Yes   Pt extubated to to 3L [place] per MD order. Pt very agitated at this time and not following commands but ok to Extubate per Dr Janee Mornhompson. Pt unable to speak or cough post extubation, however, VS within normal limits. RT will continue to closely monitor pt  Carolan ShiverKelley, Abiageal Blowe M 04/16/2017, 10:49 AM

## 2017-04-16 NOTE — Transfer of Care (Signed)
Immediate Anesthesia Transfer of Care Note  Patient: Thomas Mcfarland  Procedure(s) Performed: CRANIOTOMY HEMATOMA EVACUATION SUBDURAL (Right Head)  Patient Location: ICU  Anesthesia Type:General  Level of Consciousness: sedated and Patient remains intubated per anesthesia plan  Airway & Oxygen Therapy: Patient remains intubated per anesthesia plan and Patient placed on Ventilator (see vital sign flow sheet for setting)  Post-op Assessment: Report given to RN and Post -op Vital signs reviewed and stable  Post vital signs: Reviewed and stable  Last Vitals:  Vitals:   04/15/17 2332 04/15/17 2335  BP:  (!) 126/91  Pulse: 84 83  Resp: (!) 24 18  Temp:    SpO2: 100% 100%    Last Pain:  Vitals:   04/15/17 2202  TempSrc: Temporal         Complications: No apparent anesthesia complications

## 2017-04-17 ENCOUNTER — Encounter (HOSPITAL_COMMUNITY): Payer: Self-pay | Admitting: Physical Medicine and Rehabilitation

## 2017-04-17 ENCOUNTER — Inpatient Hospital Stay (HOSPITAL_COMMUNITY): Payer: Medicaid Other

## 2017-04-17 DIAGNOSIS — S069X3S Unspecified intracranial injury with loss of consciousness of 1 hour to 5 hours 59 minutes, sequela: Secondary | ICD-10-CM

## 2017-04-17 LAB — BASIC METABOLIC PANEL
ANION GAP: 7 (ref 5–15)
BUN: 8 mg/dL (ref 6–20)
CO2: 24 mmol/L (ref 22–32)
Calcium: 9.1 mg/dL (ref 8.9–10.3)
Chloride: 102 mmol/L (ref 101–111)
Creatinine, Ser: 0.69 mg/dL (ref 0.61–1.24)
GLUCOSE: 129 mg/dL — AB (ref 65–99)
POTASSIUM: 3.6 mmol/L (ref 3.5–5.1)
Sodium: 133 mmol/L — ABNORMAL LOW (ref 135–145)

## 2017-04-17 LAB — BPAM RBC
BLOOD PRODUCT EXPIRATION DATE: 201812262359
Blood Product Expiration Date: 201812222359
Blood Product Expiration Date: 201901012359
Blood Product Expiration Date: 201901072359
Blood Product Expiration Date: 201901092359
Blood Product Expiration Date: 201901092359
ISSUE DATE / TIME: 201812172201
ISSUE DATE / TIME: 201812170214
ISSUE DATE / TIME: 201812170214
ISSUE DATE / TIME: 201812171520
UNIT TYPE AND RH: 5100
UNIT TYPE AND RH: 5100
UNIT TYPE AND RH: 9500
Unit Type and Rh: 5100
Unit Type and Rh: 5100
Unit Type and Rh: 9500

## 2017-04-17 LAB — TYPE AND SCREEN
ABO/RH(D): O POS
Antibody Screen: NEGATIVE
UNIT DIVISION: 0
UNIT DIVISION: 0
UNIT DIVISION: 0
UNIT DIVISION: 0
Unit division: 0
Unit division: 0

## 2017-04-17 LAB — CBC
HEMATOCRIT: 31 % — AB (ref 39.0–52.0)
HEMOGLOBIN: 10.5 g/dL — AB (ref 13.0–17.0)
MCH: 31 pg (ref 26.0–34.0)
MCHC: 33.9 g/dL (ref 30.0–36.0)
MCV: 91.4 fL (ref 78.0–100.0)
Platelets: 135 10*3/uL — ABNORMAL LOW (ref 150–400)
RBC: 3.39 MIL/uL — AB (ref 4.22–5.81)
RDW: 12.8 % (ref 11.5–15.5)
WBC: 9.4 10*3/uL (ref 4.0–10.5)

## 2017-04-17 MED ORDER — ORAL CARE MOUTH RINSE
15.0000 mL | Freq: Two times a day (BID) | OROMUCOSAL | Status: DC
  Administered 2017-04-19: 15 mL via OROMUCOSAL

## 2017-04-17 MED ORDER — RESOURCE THICKENUP CLEAR PO POWD
ORAL | Status: DC | PRN
  Filled 2017-04-17: qty 125

## 2017-04-17 NOTE — Progress Notes (Signed)
PT Cancellation Note  Patient Details Name: Charlett BlakeShawn Fontaine MRN: [id number] DOB: November 15, 1975   Cancelled Treatment:    Reason Eval/Treat Not Completed: Medical issues which prohibited therapy(continue to await increased activity order from bedrest)   Kaelyn Innocent B Murvin Gift 04/17/2017, 7:15 AM  Delaney MeigsMaija Tabor Lorence Nagengast, PT [phone]
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
## 2017-04-17 NOTE — Progress Notes (Signed)
I will follow up with patient and family tomorrow to discuss goals and expectations of an inpt rehab admission. Venue determination to be determined after that discussion. Noted use of precedex continues. [phone]
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
## 2017-04-17 NOTE — Plan of Care (Signed)
Patient started on Dys 2, honey thick diet today.  Poor appetite.  Foley discontinued and post-removal void accomplished. Patient continues to be agitated when awake and needing Precedex to keep calm.
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
## 2017-04-17 NOTE — Progress Notes (Signed)
Rehab Admissions Coordinator Note:  Patient was screened by Trish MageLogue, Ata Pecha M for appropriateness for an Inpatient Acute Rehab Consult.  At this time, we are recommending Inpatient Rehab consult.  Trish MageLogue, Fusaye Wachtel M 04/17/2017, 12:24 PM  I can be reached at [phone].
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
## 2017-04-18 LAB — BASIC METABOLIC PANEL
ANION GAP: 9 (ref 5–15)
BUN: 9 mg/dL (ref 6–20)
CALCIUM: 9.5 mg/dL (ref 8.9–10.3)
CO2: 22 mmol/L (ref 22–32)
Chloride: 108 mmol/L (ref 101–111)
Creatinine, Ser: 0.71 mg/dL (ref 0.61–1.24)
GLUCOSE: 122 mg/dL — AB (ref 65–99)
POTASSIUM: 3.5 mmol/L (ref 3.5–5.1)
SODIUM: 139 mmol/L (ref 135–145)

## 2017-04-18 MED ORDER — BUTALBITAL-APAP-CAFFEINE 50-325-40 MG PO TABS
2.0000 | ORAL_TABLET | ORAL | Status: DC | PRN
  Administered 2017-04-18 – 2017-04-19 (×3): 2 via ORAL
  Filled 2017-04-18 (×3): qty 2

## 2017-04-18 MED ORDER — TRAMADOL HCL 50 MG PO TABS
50.0000 mg | ORAL_TABLET | Freq: Four times a day (QID) | ORAL | Status: DC
  Administered 2017-04-18 – 2017-04-19 (×5): 50 mg via ORAL
  Filled 2017-04-18 (×5): qty 1

## 2017-04-18 MED ORDER — OXYCODONE HCL 5 MG PO TABS
5.0000 mg | ORAL_TABLET | ORAL | Status: DC | PRN
  Administered 2017-04-19 (×2): 5 mg via ORAL
  Filled 2017-04-18 (×2): qty 1

## 2017-04-18 MED ORDER — FENTANYL CITRATE (PF) 100 MCG/2ML IJ SOLN
25.0000 ug | INTRAMUSCULAR | Status: DC | PRN
  Administered 2017-04-18 – 2017-04-19 (×12): 50 ug via INTRAVENOUS
  Filled 2017-04-18 (×12): qty 2
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
## 2017-04-18 NOTE — Clinical Social Work Note (Signed)
Clinical Social Worker unable to complete SBIRT with patient due to current cognitive status.  Patient working with therapies and plans to admit to inpatient rehab once Precedex drip is no longer present.  CSW to re-evaluate if patient mental status clears before discharge.  Macario GoldsJesse Mysti Haley, KentuckyLCSW [phone]
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
## 2017-04-18 NOTE — Progress Notes (Signed)
I met with pt's girlfriend at bedside as patient working with physical therapy. I then contacted his Dad, Keenan Bachelor, by phone to discuss goals and expectations of an inpt rehab admission. His parents can provide 24/7 assist in his care. Discussed with RN and pt weaning off precedex. I await medical readiness to admit pt to inpt rehab. Hopefully in the next 24 to 48 hrs. I will follow. [phone]

## 2017-04-19 ENCOUNTER — Inpatient Hospital Stay (HOSPITAL_COMMUNITY)
Admission: RE | Admit: 2017-04-19 | Discharge: 2017-04-24 | DRG: 945 | Disposition: A | Discharge status: Home Health | Payer: Medicaid Other | Source: Intra-hospital | Attending: Physical Medicine & Rehabilitation | Admitting: Physical Medicine & Rehabilitation

## 2017-04-19 DIAGNOSIS — S0219XD Other fracture of base of skull, subsequent encounter for fracture with routine healing: Secondary | ICD-10-CM

## 2017-04-19 DIAGNOSIS — Z818 Family history of other mental and behavioral disorders: Secondary | ICD-10-CM

## 2017-04-19 DIAGNOSIS — G44329 Chronic post-traumatic headache, not intractable: Secondary | ICD-10-CM

## 2017-04-19 DIAGNOSIS — R569 Unspecified convulsions: Secondary | ICD-10-CM | POA: Diagnosis present

## 2017-04-19 DIAGNOSIS — E871 Hypo-osmolality and hyponatremia: Secondary | ICD-10-CM | POA: Diagnosis present

## 2017-04-19 DIAGNOSIS — F329 Major depressive disorder, single episode, unspecified: Secondary | ICD-10-CM | POA: Diagnosis present

## 2017-04-19 DIAGNOSIS — E876 Hypokalemia: Secondary | ICD-10-CM

## 2017-04-19 DIAGNOSIS — S064X9D Epidural hemorrhage with loss of consciousness of unspecified duration, subsequent encounter: Secondary | ICD-10-CM | POA: Diagnosis present

## 2017-04-19 DIAGNOSIS — Z87820 Personal history of traumatic brain injury: Secondary | ICD-10-CM

## 2017-04-19 DIAGNOSIS — S066X9D Traumatic subarachnoid hemorrhage with loss of consciousness of unspecified duration, subsequent encounter: Secondary | ICD-10-CM

## 2017-04-19 DIAGNOSIS — G479 Sleep disorder, unspecified: Secondary | ICD-10-CM

## 2017-04-19 DIAGNOSIS — S062X9A Diffuse traumatic brain injury with loss of consciousness of unspecified duration, initial encounter: Secondary | ICD-10-CM

## 2017-04-19 DIAGNOSIS — D62 Acute posthemorrhagic anemia: Secondary | ICD-10-CM | POA: Diagnosis present

## 2017-04-19 DIAGNOSIS — G441 Vascular headache, not elsewhere classified: Secondary | ICD-10-CM

## 2017-04-19 DIAGNOSIS — R4587 Impulsiveness: Secondary | ICD-10-CM | POA: Diagnosis present

## 2017-04-19 DIAGNOSIS — R739 Hyperglycemia, unspecified: Secondary | ICD-10-CM

## 2017-04-19 DIAGNOSIS — R4189 Other symptoms and signs involving cognitive functions and awareness: Secondary | ICD-10-CM | POA: Diagnosis present

## 2017-04-19 DIAGNOSIS — S02119D Unspecified fracture of occiput, subsequent encounter for fracture with routine healing: Secondary | ICD-10-CM

## 2017-04-19 DIAGNOSIS — G4700 Insomnia, unspecified: Secondary | ICD-10-CM | POA: Diagnosis present

## 2017-04-19 LAB — TRIGLYCERIDES: Triglycerides: 98 mg/dL (ref <150)

## 2017-04-19 LAB — BASIC METABOLIC PANEL
ANION GAP: 7 (ref 5–15)
BUN: 8 mg/dL (ref 6–20)
CALCIUM: 9.4 mg/dL (ref 8.9–10.3)
CO2: 26 mmol/L (ref 22–32)
Chloride: 105 mmol/L (ref 101–111)
Creatinine, Ser: 0.7 mg/dL (ref 0.61–1.24)
Glucose, Bld: 102 mg/dL — ABNORMAL HIGH (ref 65–99)
POTASSIUM: 3 mmol/L — AB (ref 3.5–5.1)
SODIUM: 138 mmol/L (ref 135–145)

## 2017-04-19 MED ORDER — TRAMADOL HCL 50 MG PO TABS
100.0000 mg | ORAL_TABLET | Freq: Four times a day (QID) | ORAL | Status: DC
  Administered 2017-04-19: 100 mg via ORAL
  Filled 2017-04-19: qty 2

## 2017-04-19 MED ORDER — PROCHLORPERAZINE EDISYLATE 5 MG/ML IJ SOLN: 5.0000 mg | Freq: Four times a day (QID) | INTRAMUSCULAR | Status: DC | PRN

## 2017-04-19 MED ORDER — LEVETIRACETAM 500 MG PO TABS
500.0000 mg | ORAL_TABLET | Freq: Two times a day (BID) | ORAL | Status: DC
  Administered 2017-04-19 – 2017-04-24 (×10): 500 mg via ORAL
  Filled 2017-04-19 (×10): qty 1

## 2017-04-19 MED ORDER — QUETIAPINE FUMARATE 25 MG PO TABS: 25.0000 mg | ORAL_TABLET | Freq: Two times a day (BID) | ORAL | Status: DC

## 2017-04-19 MED ORDER — ACETAMINOPHEN 325 MG PO TABS
325.0000 mg | ORAL_TABLET | ORAL | Status: DC | PRN
  Administered 2017-04-20: 325 mg via ORAL
  Administered 2017-04-20 – 2017-04-21 (×4): 650 mg via ORAL
  Administered 2017-04-21: 325 mg via ORAL
  Administered 2017-04-22 – 2017-04-24 (×7): 650 mg via ORAL
  Filled 2017-04-19 (×9): qty 2
  Filled 2017-04-19: qty 1
  Filled 2017-04-19 (×4): qty 2

## 2017-04-19 MED ORDER — CIPROFLOXACIN-DEXAMETHASONE 0.3-0.1 % OT SUSP
4.0000 [drp] | Freq: Two times a day (BID) | OTIC | Status: DC
  Administered 2017-04-19 – 2017-04-24 (×10): 4 [drp] via OTIC
  Filled 2017-04-19: qty 7.5

## 2017-04-19 MED ORDER — PROCHLORPERAZINE 25 MG RE SUPP: 12.5000 mg | Freq: Four times a day (QID) | RECTAL | Status: DC | PRN

## 2017-04-19 MED ORDER — METHOCARBAMOL 500 MG PO TABS
500.0000 mg | ORAL_TABLET | Freq: Four times a day (QID) | ORAL | Status: DC | PRN
  Administered 2017-04-19 – 2017-04-21 (×5): 500 mg via ORAL
  Filled 2017-04-19 (×5): qty 1

## 2017-04-19 MED ORDER — POLYETHYLENE GLYCOL 3350 17 G PO PACK: 17.0000 g | PACK | Freq: Every day | ORAL | Status: DC | PRN

## 2017-04-19 MED ORDER — QUETIAPINE FUMARATE 25 MG PO TABS
25.0000 mg | ORAL_TABLET | Freq: Three times a day (TID) | ORAL | Status: DC | PRN
  Administered 2017-04-20: 25 mg via ORAL
  Filled 2017-04-19: qty 1

## 2017-04-19 MED ORDER — ALUM & MAG HYDROXIDE-SIMETH 200-200-20 MG/5ML PO SUSP: 30.0000 mL | ORAL | Status: DC | PRN

## 2017-04-19 MED ORDER — PREMIER PROTEIN SHAKE
11.0000 [oz_av] | Freq: Three times a day (TID) | ORAL | Status: DC
  Administered 2017-04-19 – 2017-04-22 (×7): 11 [oz_av] via ORAL
  Filled 2017-04-19 (×25): qty 325.31

## 2017-04-19 MED ORDER — DIPHENHYDRAMINE HCL 12.5 MG/5ML PO ELIX: 12.5000 mg | ORAL_SOLUTION | Freq: Four times a day (QID) | ORAL | Status: DC | PRN

## 2017-04-19 MED ORDER — GUAIFENESIN-DM 100-10 MG/5ML PO SYRP: 5.0000 mL | ORAL_SOLUTION | Freq: Four times a day (QID) | ORAL | Status: DC | PRN

## 2017-04-19 MED ORDER — TRAZODONE HCL 50 MG PO TABS
25.0000 mg | ORAL_TABLET | Freq: Every evening | ORAL | Status: DC | PRN
  Administered 2017-04-19: 50 mg via ORAL
  Filled 2017-04-19: qty 1

## 2017-04-19 MED ORDER — RESOURCE THICKENUP CLEAR PO POWD
ORAL | Status: DC | PRN
  Filled 2017-04-19: qty 125

## 2017-04-19 MED ORDER — ORAL CARE MOUTH RINSE
15.0000 mL | Freq: Two times a day (BID) | OROMUCOSAL | Status: DC
  Administered 2017-04-20 – 2017-04-22 (×5): 15 mL via OROMUCOSAL

## 2017-04-19 MED ORDER — PANTOPRAZOLE SODIUM 40 MG PO TBEC
40.0000 mg | DELAYED_RELEASE_TABLET | Freq: Every day | ORAL | Status: DC
  Administered 2017-04-20 – 2017-04-24 (×5): 40 mg via ORAL
  Filled 2017-04-19 (×5): qty 1

## 2017-04-19 MED ORDER — SUMATRIPTAN SUCCINATE 100 MG PO TABS
100.0000 mg | ORAL_TABLET | Freq: Two times a day (BID) | ORAL | Status: DC | PRN
  Administered 2017-04-19 – 2017-04-23 (×4): 100 mg via ORAL
  Filled 2017-04-19 (×6): qty 1

## 2017-04-19 MED ORDER — PANTOPRAZOLE SODIUM 40 MG IV SOLR: 40.0000 mg | Freq: Every day | INTRAVENOUS | Status: DC

## 2017-04-19 MED ORDER — POTASSIUM CHLORIDE CRYS ER 20 MEQ PO TBCR
40.0000 meq | EXTENDED_RELEASE_TABLET | Freq: Two times a day (BID) | ORAL | Status: DC
Start: 2017-04-19 — End: 2017-04-19
  Administered 2017-04-19: 40 meq via ORAL
  Filled 2017-04-19: qty 2

## 2017-04-19 MED ORDER — POTASSIUM CHLORIDE CRYS ER 20 MEQ PO TBCR
40.0000 meq | EXTENDED_RELEASE_TABLET | Freq: Two times a day (BID) | ORAL | Status: AC
  Administered 2017-04-19: 40 meq via ORAL
  Filled 2017-04-19: qty 2

## 2017-04-19 MED ORDER — FLEET ENEMA 7-19 GM/118ML RE ENEM: 1.0000 | ENEMA | Freq: Once | RECTAL | Status: DC | PRN

## 2017-04-19 MED ORDER — SENNOSIDES-DOCUSATE SODIUM 8.6-50 MG PO TABS
2.0000 | ORAL_TABLET | Freq: Every day | ORAL | Status: DC
  Administered 2017-04-19 – 2017-04-22 (×4): 2 via ORAL
  Filled 2017-04-19 (×5): qty 2

## 2017-04-19 MED ORDER — BISACODYL 10 MG RE SUPP: 10.0000 mg | Freq: Every day | RECTAL | Status: DC | PRN

## 2017-04-19 MED ORDER — OXYCODONE HCL 5 MG PO TABS
10.0000 mg | ORAL_TABLET | ORAL | Status: DC | PRN
  Administered 2017-04-19 – 2017-04-20 (×6): 15 mg via ORAL
  Administered 2017-04-21: 10 mg via ORAL
  Administered 2017-04-21: 15 mg via ORAL
  Administered 2017-04-21: 10 mg via ORAL
  Filled 2017-04-19 (×4): qty 3
  Filled 2017-04-19: qty 2
  Filled 2017-04-19 (×6): qty 3

## 2017-04-19 MED ORDER — PROCHLORPERAZINE MALEATE 5 MG PO TABS: 5.0000 mg | ORAL_TABLET | Freq: Four times a day (QID) | ORAL | Status: DC | PRN

## 2017-04-19 MED ORDER — QUETIAPINE FUMARATE 100 MG PO TABS
100.0000 mg | ORAL_TABLET | Freq: Every day | ORAL | Status: DC
  Administered 2017-04-19 – 2017-04-23 (×5): 100 mg via ORAL
  Filled 2017-04-19 (×5): qty 1

## 2017-04-19 MED ORDER — SUMATRIPTAN SUCCINATE 100 MG PO TABS
100.0000 mg | ORAL_TABLET | Freq: Two times a day (BID) | ORAL | Status: DC | PRN
  Administered 2017-04-19: 100 mg via ORAL
  Filled 2017-04-19 (×3): qty 1

## 2017-04-19 NOTE — Progress Notes (Signed)
Patient ID: Charlett BlakeShawn Caisse, male   DOB: 15-Aug-1975, 41 y.o.   MRN: [id number] Subjective: Patient reports some headache, wants to shower  Objective: Vital signs in last 24 hours: Temp:  [98.1 F (36.7 C)-98.9 F (37.2 C)] 98.2 F (36.8 C) (12/20 0400) Pulse Rate:  [44-71] 51 (12/20 0800) Resp:  [10-21] 14 (12/20 0800) BP: (105-136)/(52-103) 128/88 (12/20 0800) SpO2:  [94 %-99 %] 98 % (12/20 0800)  Intake/Output from previous day: 12/19 0701 - 12/20 0700 In: 1692.8 [P.O.:240; I.V.:1347.8; IV Piggyback:105] Out: -  Intake/Output this shift: Total I/O In: 50 [I.V.:50] Out: -   Neurologic: Grossly normal, incision CDI MAE  Lab Results: Lab Results  Component Value Date   WBC 9.4 04/17/2017   HGB 10.5 (L) 04/17/2017   HCT 31.0 (L) 04/17/2017   MCV 91.4 04/17/2017   PLT 135 (L) 04/17/2017   Lab Results  Component Value Date   INR 0.93 04/15/2017   BMET Lab Results  Component Value Date   NA 138 04/19/2017   K 3.0 (L) 04/19/2017   CL 105 04/19/2017   CO2 26 04/19/2017   GLUCOSE 102 (H) 04/19/2017   BUN 8 04/19/2017   CREATININE 0.70 04/19/2017   CALCIUM 9.4 04/19/2017    Studies/Results: No results found.  Assessment/Plan: Doing well, rehab candidate   LOS: 3 days    Sharonica Kraszewski S 04/19/2017, 9:32 AM
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
## 2017-04-19 NOTE — Progress Notes (Signed)
I have contacted pt's Dad and we will meet at 2 pm to go over final arrangements to admit pt to CIR today. We can plan admit today and Trauma MD is aware, RN CM and SW. [phone]
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
## 2017-04-19 NOTE — Progress Notes (Signed)
Report given to RN on [facility]. Patient's belongings packed up. Will take via wheelchair. Family at the bedside. Carolann Brazell, Dayton ScrapeSarah E, RN

## 2017-04-20 ENCOUNTER — Inpatient Hospital Stay (HOSPITAL_COMMUNITY): Payer: Medicaid Other | Admitting: Speech Pathology

## 2017-04-20 ENCOUNTER — Other Ambulatory Visit: Payer: Self-pay

## 2017-04-20 ENCOUNTER — Inpatient Hospital Stay (HOSPITAL_COMMUNITY): Payer: Self-pay

## 2017-04-20 ENCOUNTER — Inpatient Hospital Stay (HOSPITAL_COMMUNITY): Payer: Self-pay | Admitting: Occupational Therapy

## 2017-04-20 ENCOUNTER — Encounter (HOSPITAL_COMMUNITY): Payer: Self-pay

## 2017-04-20 DIAGNOSIS — G4701 Insomnia due to medical condition: Secondary | ICD-10-CM

## 2017-04-20 DIAGNOSIS — G44311 Acute post-traumatic headache, intractable: Secondary | ICD-10-CM

## 2017-04-20 DIAGNOSIS — E876 Hypokalemia: Secondary | ICD-10-CM

## 2017-04-20 LAB — COMPREHENSIVE METABOLIC PANEL
ALBUMIN: 3.5 g/dL (ref 3.5–5.0)
ALT: 46 U/L (ref 17–63)
ANION GAP: 10 (ref 5–15)
AST: 115 U/L — ABNORMAL HIGH (ref 15–41)
Alkaline Phosphatase: 72 U/L (ref 38–126)
BILIRUBIN TOTAL: 0.5 mg/dL (ref 0.3–1.2)
BUN: 9 mg/dL (ref 6–20)
CHLORIDE: 101 mmol/L (ref 101–111)
CO2: 25 mmol/L (ref 22–32)
Calcium: 9.9 mg/dL (ref 8.9–10.3)
Creatinine, Ser: 0.74 mg/dL (ref 0.61–1.24)
GFR calc Af Amer: >60 mL/min (ref >60)
Glucose, Bld: 113 mg/dL — ABNORMAL HIGH (ref 65–99)
POTASSIUM: 3 mmol/L — AB (ref 3.5–5.1)
Sodium: 136 mmol/L (ref 135–145)
TOTAL PROTEIN: 7.1 g/dL (ref 6.5–8.1)

## 2017-04-20 LAB — CBC WITH DIFFERENTIAL/PLATELET
BASOS ABS: 0 10*3/uL (ref 0.0–0.1)
BASOS PCT: 0 %
EOS PCT: 2 %
Eosinophils Absolute: 0.1 10*3/uL (ref 0.0–0.7)
HEMATOCRIT: 31 % — AB (ref 39.0–52.0)
Hemoglobin: 10.8 g/dL — ABNORMAL LOW (ref 13.0–17.0)
Lymphocytes Relative: 20 %
Lymphs Abs: 1.6 10*3/uL (ref 0.7–4.0)
MCH: 31.8 pg (ref 26.0–34.0)
MCHC: 34.8 g/dL (ref 30.0–36.0)
MCV: 91.2 fL (ref 78.0–100.0)
MONO ABS: 0.7 10*3/uL (ref 0.1–1.0)
MONOS PCT: 9 %
NEUTROS ABS: 5.2 10*3/uL (ref 1.7–7.7)
Neutrophils Relative %: 69 %
PLATELETS: 201 10*3/uL (ref 150–400)
RBC: 3.4 MIL/uL — ABNORMAL LOW (ref 4.22–5.81)
RDW: 12.4 % (ref 11.5–15.5)
WBC: 7.6 10*3/uL (ref 4.0–10.5)

## 2017-04-20 MED ORDER — TOPIRAMATE 25 MG PO TABS
25.0000 mg | ORAL_TABLET | Freq: Two times a day (BID) | ORAL | Status: DC
  Administered 2017-04-20 – 2017-04-24 (×9): 25 mg via ORAL
  Filled 2017-04-20 (×9): qty 1

## 2017-04-20 MED ORDER — POTASSIUM CHLORIDE CRYS ER 20 MEQ PO TBCR
20.0000 meq | EXTENDED_RELEASE_TABLET | Freq: Every day | ORAL | Status: DC
  Administered 2017-04-20 – 2017-04-24 (×5): 20 meq via ORAL
  Filled 2017-04-20 (×5): qty 1
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
## 2017-04-20 NOTE — Plan of Care (Signed)
LTGs established 04/20/17

## 2017-04-20 NOTE — Progress Notes (Signed)
Patient has just got up out of bed without assistance. Girlfriend jumped up to assist. He is very impulsive & does not understand why he cannot take a shower or walk around on his own. In report, was informed that on the other unit, he would sit in the shower & it would gve him some relief, but it did not last night & he was given multiple doses of narcotics on an almost hourly schedule. He has been c/o an intractable headache since the beginning of the shift. He was just given his 2nd dose of oxycodone 15mg . He has had tylenol as well. His medications right now are around the clock with a pain scale of 10 that never decreases. The incision to the right scalp shows no sign of irritation. He does have some flushing around his neck & ears, multiple bruises & abrasions. He is directible at this time, but had to be physically guided back to bed. The girlfriend was informed that she is not to ambulate him until checked off by therapy & she understood. He jumped up fast & she stated that she didn't know he was going to get up until he did. Patient moves well, but very slowly & tried to sit on the footrest to the recliner in the room. The girlfriend encouraged him to go back to bed but he kept going until physically stopped & guided back by staff. Vitals are wnl. He ate some fruit during the night & had staff remove his SCDs. Will continue to monitor.
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
## 2017-04-20 NOTE — Progress Notes (Signed)
Patient was standing on the bed as per his girlfriend.Patient's girlfriend called the nurses station & stated that he was up. By the time this nurse got to his room, he had laid back down & girlfriend was leaned over him & stated that he scared her to death. Patient was given prn seroquel dose & tylenol. Left the room, went down to the nurses station & the alarm went off. Nurse tech & nurse went to the room & found the patient standing up totally naked by the sink, trying to  Get into the shower. Girlfriend was holding him & trying to encourage him to go back to bed, but he was still going into the bathroom. Patient argued that there was nothing wrong with him & he couldn't believe that we would not let him sit in the shower. He refused to sit down. His girlfriend offered to call his father. He began to get angry & wanted him called so he could come get him. Patient still stood by the sink with his girlfriend supporting him as this nurse stood in front of the bathroom door & the tech stood in front of the door. His girlfriend told him numerous times that if he sat on the bed, she would call his father. He stood. This nurse restated what the girlfriend stated & he eventually sat down on the edge of the bed. His father was called by his girlfriend & there was a brief conversation in which he asked his father to take him home. The patient hung up & stated how "this is bullshit! I want to know why I can't sit in the shower. I don't need to be evaluated. There;s nothing wrong with me." She can stay with me in the bathroom. This nurse again explained that his girlfriend has not been checked off to transfer him & that he had to be evaluated by therapy department for safety reasons, especially since he is a new admission to the floor & had a brain injury. Patient was still adamant that he could walk by himself. He was told that once therapy evaluates him, they would be able to select what he was safe doing.Patient told this  nurse "you can go now". No acute distress noted. Will continue to monitor.
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
## 2017-04-20 NOTE — IPOC Note (Signed)
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, init (HCC)     Functional Problem List: Nursing Behavior, Bowel, Endurance, Medication Management, Motor, Nutrition, Pain, Perception, Safety, Skin Integrity  PT Balance, Endurance, Motor, Behavior, Pain, Safety  OT Balance, Pain, Behavior, Cognition, Safety, Endurance, Skin Integrity  SLP Cognition  TR         Basic ADL's: OT Grooming, Bathing, Toileting, Dressing     Advanced  ADL's: OT       Transfers: PT Bed Mobility, Bed to Chair, Car, Furniture, Civil Service fast streamerloor  OT Toilet, Research scientist (life sciences)Tub/Shower     Locomotion: PT Ambulation, Stairs     Additional Impairments: OT    SLP Social Cognition, Swallowing   Social Interaction, Problem Solving, Memory, Attention, Awareness  TR      Anticipated Outcomes Item Anticipated Outcome  Self Feeding No goal  Swallowing  Mod I   Basic self-care  Marketing executiveupervision  Toileting  Supervision   Bathroom Transfers Supervision   Bowel/Bladder  Mod I  Transfers  supervision  Locomotion  supervision  Communication     Cognition  Min A   Pain  5 or less  Safety/Judgment  Minimal assist   Therapy Plan: PT Intensity: Minimum of 1-2 x/day ,45 to 90 minutes PT Frequency: 5 out of 7 days PT Duration Estimated Length of Stay: 10-14 days OT Intensity: Minimum of 1-2 x/day, 45 to 90 minutes OT Frequency: 5 out of 7 days OT Duration/Estimated Length of Stay: 10-14 days SLP Intensity: Minumum of 1-2 x/day, 30 to 90 minutes SLP Frequency: 3 to 5 out of 7 days SLP Duration/Estimated Length of Stay: 10-14 days     Team Interventions: Nursing Interventions Patient/Family Education, Disease Management/Prevention, Bowel Management, Medication Management, Pain Management, Cognitive Remediation/Compensation,  Discharge Planning, Psychosocial Support, Skin Care/Wound Management  PT interventions Ambulation/gait training, Stair training, Therapeutic Activities, Patient/family education, Warden/rangerBalance/vestibular training, Therapeutic Exercise, Functional mobility training, Community reintegration, Neuromuscular re-education  OT Interventions Warden/rangerBalance/vestibular training, Discharge planning, Pain management, Self Care/advanced ADL retraining, Therapeutic Activities, UE/LE Coordination activities, Cognitive remediation/compensation, Disease mangement/prevention, Functional mobility training, Patient/family education, Skin care/wound managment, Therapeutic Exercise, Visual/perceptual remediation/compensation, FirefighterCommunity reintegration, Fish farm managerDME/adaptive equipment instruction, Neuromuscular re-education, Psychosocial support, Splinting/orthotics, UE/LE Strength taining/ROM, Wheelchair propulsion/positioning  SLP Interventions Cognitive remediation/compensation, Environmental controls, Internal/external aids, Therapeutic Activities, Patient/family education, Functional tasks, Dysphagia/aspiration precaution training, Cueing hierarchy  TR Interventions    SW/CM Interventions Discharge Planning, Psychosocial Support, Patient/Family Education   Barriers to Discharge MD  Behavior  Nursing      PT      OT (N/A will have 24/7 supervision from family)    SLP      SW       Team Discharge Planning: Destination: PT-Home ,OT- Home , SLP-Home Projected Follow-up: PT-24 hour supervision/assistance, OT-  Home health OT, Outpatient OT, SLP-24 hour supervision/assistance, Home Health SLP, Outpatient SLP Projected Equipment Needs: PT-None recommended by PT, OT- To be determined, SLP-None recommended by SLP Equipment Details: PT- , OT-  Patient/family involved in discharge planning: PT- Patient, Family member/caregiver,  OT-Patient, Family member/caregiver, SLP-Patient, Family member/caregiver  MD ELOS: 10-14 days Medical Rehab  Prognosis:  Excellent Assessment: The patient has been admitted for CIR therapies with the diagnosis of TBI. The team will be addressing functional mobility, strength, stamina, balance, safety, adaptive techniques and equipment, self-care, bowel and bladder mgt, patient and  caregiver education, cognition, behavior, sleep-wake, pain/headaches. Goals have been set at mod I for mobility and self-care and supervision for cognition.    Ranelle OysterZachary T. Lenka Zhao, MD, FAAPMR      See Team Conference Notes for weekly updates to the plan of care

## 2017-04-21 ENCOUNTER — Inpatient Hospital Stay (HOSPITAL_COMMUNITY): Payer: Medicaid Other | Admitting: *Deleted

## 2017-04-21 ENCOUNTER — Inpatient Hospital Stay (HOSPITAL_COMMUNITY): Payer: Self-pay | Admitting: Occupational Therapy

## 2017-04-21 ENCOUNTER — Inpatient Hospital Stay (HOSPITAL_COMMUNITY): Payer: Self-pay | Admitting: Speech Pathology

## 2017-04-21 DIAGNOSIS — G441 Vascular headache, not elsewhere classified: Secondary | ICD-10-CM

## 2017-04-21 DIAGNOSIS — E876 Hypokalemia: Secondary | ICD-10-CM

## 2017-04-21 DIAGNOSIS — G44329 Chronic post-traumatic headache, not intractable: Secondary | ICD-10-CM

## 2017-04-21 DIAGNOSIS — R739 Hyperglycemia, unspecified: Secondary | ICD-10-CM

## 2017-04-21 DIAGNOSIS — G479 Sleep disorder, unspecified: Secondary | ICD-10-CM

## 2017-04-21 DIAGNOSIS — D62 Acute posthemorrhagic anemia: Secondary | ICD-10-CM

## 2017-04-21 DIAGNOSIS — G43909 Migraine, unspecified, not intractable, without status migrainosus: Secondary | ICD-10-CM

## 2017-04-21 MED ORDER — MELATONIN 3 MG PO TABS
1.5000 mg | ORAL_TABLET | Freq: Every day | ORAL | Status: DC
  Administered 2017-04-21: 1.5 mg via ORAL
  Filled 2017-04-21: qty 0.5

## 2017-04-21 NOTE — Progress Notes (Signed)
Physical Therapy Session Note  Patient Details  Name: Thomas Mcfarland MRN: [id number] Date of Birth: 01/19/1976  Today's Date: 04/21/2017 PT Individual Time: 1435-1530 PT Individual Time Calculation (min): 55 min   Short Term Goals: Week 1:  PT Short Term Goal 1 (Week 1): Patient will demonstrate decreased fall risk with Berg sore at least 52/56. PT Short Term Goal 2 (Week 1): Patient to demonstrate cognitive improvement with wayfinding on rehab unit with S and questioning cues.  PT Short Term Goal 3 (Week 1): Patient to be able to multitask with physical tasks in moderately distracting environment for 50 minutes.  PT Short Term Goal 4 (Week 1): Patient S with all mobility on unit.  Skilled Therapeutic Interventions/Progress Updates:    Tx focused on functional mobility training,cognition, safety, and functional balance training. Father present, wanting to check off for transfer training. Father was instructed on all safety considerations, including fall risk reduction, and guarding, safety plan updated.   Pt was S for all mobility including sit<>stand, supine<>sit and toilet transfers. Pt needed up to min-guard A for gait in dynamic and most challenging settings.  Pt needed max cues and encouragement, given choices, to participate in all tasks. Pt requested x2 to use bathroom during tx.   Gait in cotnrolled settings 4x150' with S and 1x200' Cognitive tasks including pipe tree activity and ball toss with naming alphabet foods. Pt needed Max/total A to name foods with copious descriptions. Pt attmpeted 2 basic pipe tree tasks, needing 6 min and mod cues fo rmatching forms.    Therapy Documentation Precautions:  Precautions Precautions: Fall Precaution Comments: Rt crani Restrictions Weight Bearing Restrictions: No General:   Vital Signs: Therapy Vitals Temp: 98.6 F (37 C) Temp Source: Oral Pulse Rate: 66 Resp: 20 BP: 117/78 Patient Position (if appropriate): Sitting Oxygen  Therapy SpO2: 100 % O2 Device: Not Delivered Pain: Pain Assessment Pain Assessment: 0-10 Pain Score: 10-Worst pain ever Faces Pain Scale: Hurts a little bit Pain Type: Acute pain Pain Location: Head Pain Descriptors / Indicators: Aching Pain Onset: Gradual Pain Intervention(s): Medication (See eMAR) Mobility:   Locomotion :    Trunk/Postural Assessment :    Balance:   Exercises:   Other Treatments:     See Function Navigator for Current Functional Status.   Therapy/Group: Individual Therapy  Thomas Mcfarland, Thomas Mcfarland 04/21/2017, 3:12 PM

## 2017-04-21 NOTE — Progress Notes (Signed)
Occupational Therapy Session Note  Patient Details  Name: Thomas Mcfarland MRN: [id number] Date of Birth: 03/02/1976  Today's Date: 04/21/2017 OT Individual Time: [id number] and [id number] OT Individual Time Calculation (min): 28 min and 60 min   Short Term Goals: Week 1:  OT Short Term Goal 1 (Week 1): Pt will complete toilet transfers with supervision at ambulatory level  OT Short Term Goal 2 (Week 1): Pt will don footwear with supervision  OT Short Term Goal 3 (Week 1): Pt will initiate completion of 1 grooming task   Skilled Therapeutic Interventions/Progress Updates:    Tx focus on ADL retraining, functional transfers, cognitive remediation and safety awareness.   Pt greeted in chair eating breakfast with girlfriend present. Pt requiring supervision for encouragement to eat. Afterwards he requested to shower. Pt ambulating around room with Min A to gather necessary items, stooping to lower drawers without LOBs. Pt doffing clothing with steady assist in standing, and then transferred into shower once IV was covered and shower cap donned. He bathed standing 90% of time, propping each leg up on TTB and turning around. Pt still refusing to use soap and wash cloths, even with set up to use. Girlfriend was agreeable to bring in his own body wash to see if he will use this. Per pt, he does not use soap at home. Pt initiating end of shower, appropriately using shower controls. He dressed in standing with overall steady assist for balance. Pt ambulating back into room, tried to sit in unlocked w/c. Once safely seated, pt making phone call to son with min cues for electronic device mgt/problem solving. Afterwards he cleaned up soiled linen in bathroom, then ambulated down hallway per request with min guard-supervision. Pt tolerating mod-max stimulating environment, reading signs, and pathfinding his way back to room with min-mod cues. Pt returned to room and was left in w/c with girlfriend present. Girlfriend  verbalized she would notify staff when she left so they could supervise pt.   2nd Session 1:1 tx (28 min) Tx focus on sustained attention, activity tolerance, and dynamic balance.   Pt greeted in w/c with girlfriend present. Agreeable to tx. He ambulated with min guard-supervision to therapy apartment. Tried to engage pt in bedmaking with pt shaking his head and saying he would not attempt. Pt more attentive with sorting task involving toolbox. He described functions of certain tools and which ones he used to assemble bikes at home. Pt reported increased pain from headache. Unable to redirect him to task. Pt ambulating down to [employer] to look outside large windows (very sunny day out). He required mod questioning/instruction cues for pathfinding way back, including interpretation of signs. Once in room, pt returned to w/c. He was left with family at session exit.   Therapy Documentation Precautions:  Precautions Precautions: Fall Precaution Comments: Rt crani Restrictions Weight Bearing Restrictions: No Pain: Head pain/headaches during both sessions. RN made aware. Pt refusing cryotherapy during both sessions.  Pain Assessment Pain Assessment: 0-10 Pain Score: Asleep Faces Pain Scale: Hurts a little bit Pain Type: Acute pain Pain Location: Head Pain Descriptors / Indicators: Aching Pain Onset: Gradual Pain Intervention(s): Medication (See eMAR) ADL: ADL ADL Comments: Please see functional navigator for ADL status     See Function Navigator for Current Functional Status.  Therapy/Group: Individual Therapy  Xaniyah Buchholz A Rhyse Skowron 04/21/2017, 3:53 PM

## 2017-04-21 NOTE — Progress Notes (Signed)
Speech Language Pathology Daily Session Note  Patient Details  Name: Thomas BlakeShawn Mcfarland MRN: [id number] Date of Birth: 09/23/1975  Today's Date: 04/21/2017 SLP Individual Time: [id number] SLP Individual Time Calculation (min): 40 min  Short Term Goals: Week 1: SLP Short Term Goal 1 (Week 1): Patient will consume current diet of regular textures with thin liquids without overt s/s of aspiration with Mod I for use of swallowing compenstory strategies. SLP Short Term Goal 2 (Week 1): Patient will demonstrate functional problem solving for basic and familiar tasks with Mod A verbal cues.  SLP Short Term Goal 3 (Week 1): Patient will demonstrate sustained attention to functional tasks for 10 minutes with Mod A verbal cues for redirection.  SLP Short Term Goal 4 (Week 1): Patient will identify 1 physical and 1 cognitive deficit with Mod A multimodal cues.  SLP Short Term Goal 5 (Week 1): Patient will utilize external aids to recall daily information with Mod A verbal cues.   Skilled Therapeutic Interventions:  Pt was seen for skilled ST targeting cognitive goals.  Pt sustained his attention to a basic card game for ~10 minutes with min verbal cues for redirection to task.  When SLP increased task complexity to incorporate functional problem solving, pt needed up to mod assist to complete task due to decreased working memory of task rules and procedures as well as impulsivity.  Pt recalled route back to his room with supervision.  When returned to room, SLP modeled how to utilize therapy schedule to facilitate recall of daily information.  Pt returned demonstration of schedule to identify upcoming therapy sessions with min-mod assist verbal and visual cues.  Pt left with family at bedside. Continue per current plan of care.    Function:  Eating Eating                 Cognition Comprehension Comprehension assist level: Understands basic 90% of the time/cues < 10% of the time  Expression   Expression  assist level: Expresses basic 90% of the time/requires cueing < 10% of the time.  Social Interaction Social Interaction assist level: Interacts appropriately 75 - 89% of the time - Needs redirection for appropriate language or to initiate interaction.  Problem Solving Problem solving assist level: Solves basic 50 - 74% of the time/requires cueing 25 - 49% of the time  Memory Memory assist level: Recognizes or recalls 25 - 49% of the time/requires cueing 50 - 75% of the time    Pain Pain Assessment Pain Assessment: Faces Faces Pain Scale: Hurts a little bit Pain Type: Acute pain Pain Location: Head Pain Descriptors / Indicators: Headache Pain Onset: Gradual Pain Intervention(s): RN made aware;Distraction  Therapy/Group: Individual Therapy  Martinez Boxx, Melanee SpryNicole L 04/21/2017, 12:33 PM

## 2017-04-21 NOTE — Progress Notes (Signed)
Thomas Mcfarland PHYSICAL MEDICINE & REHABILITATION     PROGRESS NOTE    Subjective/Complaints: Patient seen lying bed this morning. Girlfriend at bedside who states patient did not sleep well overnight. Sleep chart not updated. Girlfriend states patient was restless all night and even managed to get out of bed can canopy bed. Girlfriend states patient had a good first in therapies yesterday.  ROS: Ltd. due to cognitive/behavioral   Objective: Vital Signs: Blood pressure 125/78, pulse 76, temperature 98.7 F (37.1 C), temperature source Oral, resp. rate 20, height 5\' 8"  (1.727 m), weight 72.9 kg (160 lb 11.5 oz), SpO2 100 %. No results found. Recent Labs    04/20/17 0751  WBC 7.6  HGB 10.8*  HCT 31.0*  PLT 201   Recent Labs    04/19/17 0258 04/20/17 0751  NA 138 136  K 3.0* 3.0*  CL 105 101  GLUCOSE 102* 113*  BUN 8 9  CREATININE 0.70 0.74  CALCIUM 9.4 9.9   CBG (last 3)  No results for input(s): GLUCAP in the last 72 hours.  Wt Readings from Last 3 Encounters:  04/19/17 72.9 kg (160 lb 11.5 oz)  04/16/17 78.1 kg (172 lb 2.9 oz)    Physical Exam:  Constitutional: He appears well-developed and well-nourished. NAD. HENT: Normocephalic and atraumatic.  Eyes:  keeps eyes closed.  Cardiovascular: RRR. No JVD  Respiratory:CTA Bilaterally. Normal effort   GI: Bowel sounds are normal. He exhibits no distension.  Musculoskeletal: He exhibits no edema. No tenderness Neurological: Somnolent, difficult to arouse Skin: Healing abrasions  Psychiatric:  unable to assess due to mentation   Assessment/Plan: 1.  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 Physiatrist is providing close team supervision and 24 hour management of active medical problems listed below. Physiatrist and rehab team continue to assess barriers to discharge/monitor patient  progress toward functional and medical goals.  Function:  Bathing Bathing position   Position: Shower(Pt able to reach areas and brush over them with hand, refused wash cloths/soap)  Bathing parts Body parts bathed by patient: Right arm, Left arm, Chest, Abdomen, Front perineal area, Buttocks, Right upper leg, Left upper leg, Right lower leg, Left lower leg    Bathing assist Assist Level: Touching or steadying assistance(Pt > 75%)      Upper Body Dressing/Undressing Upper body dressing   What is the patient wearing?: Pull over shirt/dress     Pull over shirt/dress - Perfomed by patient: Thread/unthread right sleeve, Thread/unthread left sleeve, Put head through opening, Pull shirt over trunk          Upper body assist Assist Level: Touching or steadying assistance(Pt > 75%)(standing)      Lower Body Dressing/Undressing Lower body dressing   What is the patient wearing?: Pants, Non-skid slipper socks           Non-skid slipper socks- Performed by helper: Don/doff right sock, Don/doff left sock                  Lower body assist Assist for lower body dressing: Touching or steadying assistance (Pt > 75%)      Toileting Toileting   Toileting steps completed by patient: Adjust clothing prior to toileting, Performs perineal hygiene, Adjust clothing after toileting      Toileting assist Assist level: Supervision or verbal cues   Transfers Chair/bed transfer   Chair/bed transfer method: Stand pivot, Ambulatory Chair/bed  transfer assist level: Touching or steadying assistance (Pt > 75%)       Locomotion Ambulation     Max distance: 250 Assist level: Touching or steadying assistance (Pt > 75%)   Wheelchair          Cognition Comprehension Comprehension assist level: Understands basic 90% of the time/cues < 10% of the time  Expression Expression assist level: Expresses basic 90% of the time/requires cueing < 10% of the time.  Social Interaction Social  Interaction assist level: Interacts appropriately 75 - 89% of the time - Needs redirection for appropriate language or to initiate interaction.  Problem Solving Problem solving assist level: Solves basic 50 - 74% of the time/requires cueing 25 - 49% of the time  Memory Memory assist level: Recognizes or recalls 25 - 49% of the time/requires cueing 50 - 75% of the time   Medical Problem List and Plan: 1.  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  DVT Prophylaxis/Anticoagulation: Mechanical:  Antiembolism stockings, knee (TED hose) Bilateral lower extremities Sequential compression devices, below knee Bilateral lower extremities 3. Pain Management: D/Ced fentanyl and ultram (seizure in ED).    Added oxycodone 10- 15 mg prn for HA.              Topiramate 25 mg twice daily for headaches started on 12/21. Patient apparently has been on this in the past, related to his initial brain injury a few years ago 4. Mood: LCSW to follow for evaluation and support.  5. Neuropsych: This patient is not capable of making decisions on his own behalf.     -cont enclosure bed due to impulsive/unsafe behavior 6. Skin/Wound Care: routine pressure relief measures. 7. Fluids/Electrolytes/Nutrition: On regular diet thin liquids currently    -Encourage p.o. 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 9. Acute on chronic HA: History of migraines per mother. Monitor for now. Work on sleep wake hygiene   See #3 10. Sleep disturbance: Continue Seroquel 100 mg at 9 PM each night   Melatonin added 12/22    Sleep hygiene.  Avoid excessive napping during the day   Continue sleep chart, needs to be updated  11. Hypokalemia   K+ 3.0 on 12/21   Labs ordered for Monday   Cont to monitor 12. Hyperglycemia   Likely stress induced   Cont to monitor 13. ABLA   Hb 10.8 on 12/21   Labs ordered for Monday    Cont to monitor  LOS (Days) 2 A FACE TO FACE EVALUATION WAS PERFORMED  Ankit Karis JubaAnil Patel, MD 04/21/2017 12:12 PM

## 2017-04-22 ENCOUNTER — Inpatient Hospital Stay (HOSPITAL_COMMUNITY): Payer: Self-pay | Admitting: Physical Therapy

## 2017-04-22 ENCOUNTER — Inpatient Hospital Stay (HOSPITAL_COMMUNITY): Payer: Self-pay | Admitting: Occupational Therapy

## 2017-04-22 ENCOUNTER — Inpatient Hospital Stay (HOSPITAL_COMMUNITY): Payer: Self-pay

## 2017-04-22 DIAGNOSIS — G43809 Other migraine, not intractable, without status migrainosus: Secondary | ICD-10-CM

## 2017-04-22 MED ORDER — MELATONIN 3 MG PO TABS
3.0000 mg | ORAL_TABLET | Freq: Every day | ORAL | Status: DC
  Administered 2017-04-22 – 2017-04-23 (×2): 3 mg via ORAL
  Filled 2017-04-22 (×2): qty 1

## 2017-04-22 NOTE — Progress Notes (Signed)
Occupational Therapy Session Note  Patient Details  Name: Thomas BlakeShawn Mcfarland MRN: [id number] Date of Birth: [birth year]  Today's Date: 04/22/2017 OT Individual Time: [id number] OT Individual Time Calculation (min): 38 min    Short Term Goals: Week 1:  OT Short Term Goal 1 (Week 1): Pt will complete toilet transfers with supervision at ambulatory level  OT Short Term Goal 2 (Week 1): Pt will don footwear with supervision  OT Short Term Goal 3 (Week 1): Pt will initiate completion of 1 grooming task   Skilled Therapeutic Interventions/Progress Updates:    1;1. Pt dons clothing with supervision as pt laying under covers with son present supervising upon arrival. Pt ambulates around unit/hospital with supervision and no LOB using signs to locate gift shop with 1 VC for pathfinding. PT given 5 items to recall and locate in gift shop. Pt able to recall 2/5 items given in gift shop and requires cuing to recall 1/3 remaining items. Pt able to locate 4/5 and last item, aimlessly point to random object and states, "there" and begins to walk out of store. Pt requires 1 question cue to fond way back to unit. In tx gym, pt able to shoot 5 baskets standing on red foam wedge. Pt given instructions on how to set up [place] game, and pt begins building tower without following pattern. After OT redirects pt, pt able to complete building tower. OT gives instructions on following game rules to pull one piece out at a time and place on top of tower. Pt pulls 6 pieces out wihtout takeing turns. Pt mother arrives with food and pt requests to end session and eat in room despite encouragement to continue with tx. Walked pt back to room with supervision and left in care of son and mother.   Therapy Documentation Precautions:  Precautions Precautions: Fall Precaution Comments: Rt crani Restrictions Weight Bearing Restrictions: No General: General OT Amount of Missed Time: 22 Minutes PT Missed Treatment Reason: Patient  fatigue Vital Signs:   See Function Navigator for Current Functional Status.   Therapy/Group: Individual Therapy  Shon HaleStephanie M Pau Banh 04/22/2017, 2:41 PM

## 2017-04-22 NOTE — Progress Notes (Signed)
Patient Dad did not want us to zip the bed all the way he said he will monitor him

## 2017-04-22 NOTE — Progress Notes (Signed)
Occupational Therapy Session Note  Patient Details  Name: Thomas Mcfarland MRN: [id number] Date of Birth: 1976-02-21  Today's Date: 04/22/2017 OT Individual Time: [id number] OT Individual Time Calculation (min): 49 min  11 minutes missed.   Short Term Goals: Week 1:  OT Short Term Goal 1 (Week 1): Pt will complete toilet transfers with supervision at ambulatory level  OT Short Term Goal 2 (Week 1): Pt will don footwear with supervision  OT Short Term Goal 3 (Week 1): Pt will initiate completion of 1 grooming task   Skilled Therapeutic Interventions/Progress Updates:    Tx focus on cognitive remediation, balance, functional transfers, and community integration during functional tasks.   Pt greeted supine in enclosure bed with father present. Per father, pt had 5-6 showers last night and was up wondering around in room for most of the night. IV dislodged upon inspection, notified RN and applied small dressing to site. Pt agreeable to go down to cafeteria. Had him pathfind way off of unit with questioning cues for reading/interpreting signs.Once in cafeteria, pt ambulating with supervision in moderately stimulating environment. Able to visually scan to locate desired food items, utensils, and containers without cues. Pt using tongs to retrieve biscuit from large plate. Had pt manage money with mod cues for waiting for cashier to return to register instead of walking past her to sit down. Pt transferring onto booth table, eating in community setting, lying down x2 due to headache. Pt required max cues for correct subtraction when computing the change he should have received from the cashier. Afterwards, he asked to use the bathroom. Pt pathfinding his way to public bathroom with min questioning cues. He used restroom with distant supervision. Afterwards pt found his way back to unit with min cues, ambulated back to his room. Pt lying down in bed and refusing additional therapy. Tried to redirect/converse with  him regarding orientation topics, pt able to provide vague 1 one word responses during conversation. He then completed toileting with distant supervision. Cleared father on safety plan to provide supervision for toilet transfers. Pt left in enclosure bed with father and friend present at session exit. 11 minutes missed due to pt refusal/unwillingness to participate.    Therapy Documentation Precautions:  Precautions Precautions: Fall Precaution Comments: Rt crani Restrictions Weight Bearing Restrictions: No General: General OT Amount of Missed Time: 11 Minutes PT Missed Treatment Reason: Patient fatigue Pain: Headache/pain. RN made aware.  Pain Assessment Pain Assessment: 0-10 Pain Score: Asleep Pain Type: Chronic pain Pain Location: Head Pain Descriptors / Indicators: Aching Pain Onset: On-going Pain Intervention(s): Medication (See eMAR)(scheduled topamax) ADL: ADL ADL Comments: Please see functional navigator for ADL status     See Function Navigator for Current Functional Status.   Therapy/Group: Individual Therapy  Dara Camargo A Saja Bartolini 04/22/2017, 12:49 PM

## 2017-04-22 NOTE — Progress Notes (Signed)
Physical Therapy Session Note  Patient Details  Name: Thomas BlakeShawn Mckowen MRN: [id number] Date of Birth: March 10, 1976  Today's Date: 04/22/2017 PT Individual Time: 1100-1140 PT Individual Time Calculation (min): 40 min   Short Term Goals: Week 1:  PT Short Term Goal 1 (Week 1): Patient will demonstrate decreased fall risk with Berg sore at least 52/56. PT Short Term Goal 2 (Week 1): Patient to demonstrate cognitive improvement with wayfinding on rehab unit with S and questioning cues.  PT Short Term Goal 3 (Week 1): Patient to be able to multitask with physical tasks in moderately distracting environment for 50 minutes.  PT Short Term Goal 4 (Week 1): Patient S with all mobility on unit.  Skilled Therapeutic Interventions/Progress Updates:   Pt prone in open vail bed, reports fatigue but with encouragement is agreeable to participate in therapy session. Bed mobility SBA, sit to stand CGA due to some unsteadiness. Ambulation x 150 ft with CGA to SBA, min v/c for directions. Pt completes DGI, 17/24 which indicates some balance deficits and that pt is a fall risk. Standing balance activity: ball toss with pt's father with CGA for balance from therapist while naming animals. Pt initially requires max v/c to name animals but is able to recall animal names himself once he gets started. Pt becomes easily bored and frustrated with therapy tasks and asks to return to his room. Pt reports feeling fatigued this AM as he did not sleep well last night. With encouragement pt agreeable to continue with therapy session. Pt given options for therapy tasks to engage in and pt is finally agreeable to engage in cognitive task playing [employer] game. Pt again becomes bored with therapy tasks and reports feeling significantly fatigued. Pt requests to end therapy session due to fatigue. Pt left seated in recliner in room with needs in reach, father present.  Therapy Documentation Precautions:  Precautions Precautions:  Fall Precaution Comments: Rt crani Restrictions Weight Bearing Restrictions: No General: PT Amount of Missed Time (min): 20 Minutes PT Missed Treatment Reason: Patient fatigue  See Function Navigator for Current Functional Status.   Therapy/Group: Individual Therapy  Peter Congoaylor Dorinda Stehr, PT, DPT  04/22/2017, 12:15 PM

## 2017-04-22 NOTE — Progress Notes (Signed)
[facility]ICINE & REHABILITATION     PROGRESS NOTE  Subjective/Complaints: Pt seen laying in canopy bed this AM, veil open.  Father at bedside.  Pt did not sleep well overnight. Initially pt states he does not want a sleep aid or anymore meds, but later states he would like something, father in agreement. Pt defers to dad to answer questions and make decision.    ROS: Limited due to cognitive/behavioral, but appears to deny CP, SOB, N/V/D.  Objective: Vital Signs: Blood pressure 128/82, pulse 77, temperature 98.4 F (36.9 C), temperature source Oral, resp. rate 19, height 5\' 8"  (1.727 m), weight 72.9 kg (160 lb 11.5 oz), SpO2 100 %. No results found. Recent Labs    04/20/17 0751  WBC 7.6  HGB 10.8*  HCT 31.0*  PLT 201   Recent Labs    04/20/17 0751  NA 136  K 3.0*  CL 101  GLUCOSE 113*  BUN 9  CREATININE 0.74  CALCIUM 9.9   CBG (last 3)  No results for input(s): GLUCAP in the last 72 hours.  Wt Readings from Last 3 Encounters:  04/19/17 72.9 kg (160 lb 11.5 oz)  04/16/17 78.1 kg (172 lb 2.9 oz)    Physical Exam:  Constitutional: He appears well-developed and well-nourished. NAD. HENT: Normocephalic and atraumatic.  Eyes:  keeps eyes closed.  Cardiovascular: RRR. No JVD  Respiratory:CTA Bilaterally. Normal effort   GI: Bowel sounds are normal. He exhibits no distension.  Musculoskeletal: He exhibits no edema. No tenderness Neurological: Alert and Oriented x2 Moving all extremities freely Skin: Healing abrasions  Psychiatric:  Signs of regression   Assessment/Plan: 1.  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 Physiatrist is providing close team supervision and 24 hour management of active medical problems listed below. Physiatrist and rehab team continue to assess barriers to discharge/monitor patient progress toward  functional and medical goals.  Function:  Bathing Bathing position   Position: Shower(Pt able to reach areas and brush over them with hand ,but refuses to use soap/wash cloths)  Bathing parts Body parts bathed by patient: Right arm, Left arm, Chest, Abdomen, Front perineal area, Buttocks, Right upper leg, Left upper leg, Right lower leg, Left lower leg    Bathing assist Assist Level: Touching or steadying assistance(Pt > 75%)      Upper Body Dressing/Undressing Upper body dressing   What is the patient wearing?: Pull over shirt/dress     Pull over shirt/dress - Perfomed by patient: Thread/unthread right sleeve, Thread/unthread left sleeve, Put head through opening, Pull shirt over trunk          Upper body assist Assist Level: Touching or steadying assistance(Pt > 75%)(standing)      Lower Body Dressing/Undressing Lower body dressing   What is the patient wearing?: Pants, Non-skid slipper socks, Shoes     Pants- Performed by patient: Thread/unthread right pants leg, Thread/unthread left pants leg, Pull pants up/down   Non-skid slipper socks- Performed by patient: Don/doff right sock, Don/doff left sock Non-skid slipper socks- Performed by helper: Don/doff right sock, Don/doff left sock     Shoes - Performed by patient: Don/doff right shoe, Don/doff left shoe, Fasten right, Fasten left            Lower body assist Assist for lower body dressing: Touching or steadying assistance (Pt > 75%)      LexicographerToileting Toileting   Toileting  steps completed by patient: Adjust clothing prior to toileting, Performs perineal hygiene, Adjust clothing after toileting      Toileting assist Assist level: Supervision or verbal cues   Transfers Chair/bed transfer   Chair/bed transfer method: Ambulatory Chair/bed transfer assist level: Touching or steadying assistance (Pt > 75%)       Locomotion Ambulation     Max distance: 150 Assist level: Touching or steadying assistance (Pt >  75%)   Wheelchair          Cognition Comprehension Comprehension assist level: Understands complex 90% of the time/cues 10% of the time  Expression Expression assist level: Expresses basic 90% of the time/requires cueing < 10% of the time.  Social Interaction Social Interaction assist level: Interacts appropriately 50 - 74% of the time - May be physically or verbally inappropriate.  Problem Solving Problem solving assist level: Solves basic 50 - 74% of the time/requires cueing 25 - 49% of the time  Memory Memory assist level: Recognizes or recalls 25 - 49% of the time/requires cueing 50 - 75% of the time   Medical Problem List and Plan: 1.  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  DVT Prophylaxis/Anticoagulation: Mechanical:  Antiembolism stockings, knee (TED hose) Bilateral lower extremities Sequential compression devices, below knee Bilateral lower extremities 3. Pain Management: D/Ced fentanyl and ultram (seizure in ED).    Added oxycodone 10- 15 mg prn for HA.              Topiramate 25 mg twice daily for headaches started on 12/21. Patient apparently has been on this in the past, related to his initial brain injury a few years ago 4. Mood: LCSW to follow for evaluation and support.  5. Neuropsych: This patient is not capable of making decisions on his own behalf.     -cont enclosure bed due to impulsive/unsafe behavior 6. Skin/Wound Care: routine pressure relief measures. 7. Fluids/Electrolytes/Nutrition: On regular diet thin liquids currently    -Encourage p.o. 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 9. Acute on chronic HA: History of migraines per mother. Monitor for now. Work on sleep wake hygiene   See #3 10. Sleep disturbance: Continue Seroquel 100 mg at 9 PM each night   Melatonin added 12/22, increased on 12/23   Sleep hygiene.  Avoid excessive napping during the day   Continue sleep chart, needs to  be updated  11. Hypokalemia   K+ 3.0 on 12/21   Labs ordered for tomorrow   Cont to monitor 12. Hyperglycemia   Likely stress induced   Cont to monitor 13. ABLA   Hb 10.8 on 12/21   Labs ordered for tomorrow   Cont to monitor  LOS (Days) 3 A FACE TO FACE EVALUATION WAS PERFORMED  Robbie Rideaux Karis JubaAnil Sakoya Win, MD 04/22/2017 3:31 PM

## 2017-04-23 ENCOUNTER — Inpatient Hospital Stay (HOSPITAL_COMMUNITY): Payer: Self-pay | Admitting: Physical Therapy

## 2017-04-23 ENCOUNTER — Inpatient Hospital Stay (HOSPITAL_COMMUNITY): Payer: Medicaid Other | Admitting: Speech Pathology

## 2017-04-23 ENCOUNTER — Inpatient Hospital Stay (HOSPITAL_COMMUNITY): Payer: Self-pay | Admitting: Occupational Therapy

## 2017-04-23 DIAGNOSIS — E871 Hypo-osmolality and hyponatremia: Secondary | ICD-10-CM

## 2017-04-23 LAB — BASIC METABOLIC PANEL
Anion gap: 9 (ref 5–15)
BUN: 20 mg/dL (ref 6–20)
CALCIUM: 10.1 mg/dL (ref 8.9–10.3)
CHLORIDE: 102 mmol/L (ref 101–111)
CO2: 23 mmol/L (ref 22–32)
CREATININE: 0.85 mg/dL (ref 0.61–1.24)
GFR calc non Af Amer: >60 mL/min (ref >60)
Glucose, Bld: 105 mg/dL — ABNORMAL HIGH (ref 65–99)
Potassium: 3.6 mmol/L (ref 3.5–5.1)
SODIUM: 134 mmol/L — AB (ref 135–145)

## 2017-04-23 MED ORDER — LEVETIRACETAM 500 MG PO TABS: 500.0000 mg | ORAL_TABLET | Freq: Two times a day (BID) | ORAL | 0 refills | Status: DC

## 2017-04-23 MED ORDER — QUETIAPINE FUMARATE 100 MG PO TABS: 100.0000 mg | ORAL_TABLET | Freq: Every day | ORAL | 0 refills | Status: DC

## 2017-04-23 MED ORDER — MELATONIN 3 MG PO TABS: 3.0000 mg | ORAL_TABLET | Freq: Every day | ORAL | 0 refills | Status: AC

## 2017-04-23 MED ORDER — TOPIRAMATE 25 MG PO TABS: 25.0000 mg | ORAL_TABLET | Freq: Two times a day (BID) | ORAL | 0 refills | Status: AC

## 2017-04-23 MED ORDER — OXYCODONE HCL 10 MG PO TABS: 10.0000 mg | ORAL_TABLET | ORAL | 0 refills | Status: DC | PRN

## 2017-04-23 MED ORDER — SUMATRIPTAN SUCCINATE 100 MG PO TABS: 100.0000 mg | ORAL_TABLET | Freq: Two times a day (BID) | ORAL | 0 refills | Status: AC | PRN

## 2017-04-23 MED ORDER — PANTOPRAZOLE SODIUM 40 MG PO TBEC: 40.0000 mg | DELAYED_RELEASE_TABLET | Freq: Every day | ORAL | 0 refills | Status: AC

## 2017-04-23 MED ORDER — METHOCARBAMOL 500 MG PO TABS: 500.0000 mg | ORAL_TABLET | Freq: Four times a day (QID) | ORAL | 0 refills | Status: DC | PRN

## 2017-04-23 MED ORDER — CIPROFLOXACIN-DEXAMETHASONE 0.3-0.1 % OT SUSP: 4.0000 [drp] | Freq: Two times a day (BID) | OTIC | 0 refills | Status: AC

## 2017-04-23 NOTE — Progress Notes (Signed)
Physical Therapy Discharge Summary  Patient Details  Name: Thomas Mcfarland MRN: [id number] Date of Birth: 1976-03-25  Today's Date: 04/23/2017 PT Individual Time: 1500-1530 PT Individual Time Calculation (min): 30 min   Skilled Therapeutic Intervention: Pt supine in bed, agreeable to participate in therapy session. Function Tool items assessed: car transfer Mod I, ambulation x 1000 ft Mod I, ascend/descend 12 stairs with no handrail Mod I, ambulation across uneven surface Mod I, retrieve object from floor Mod I. Pt performs all transfers and mobility at a Mod I level, will require Supervision from family at d/c home due to ongoing cognitive deficits. Focus on navigation task having pt take therapist to various parts of the hospital following signs for directions. Pt is able to correctly find cafeteria and gift shop as well as return to rehab floor with minimal cueing. Pt left supine in bed with needs in reach, family present.   Patient has met 11 of 11 long term goals due to improved activity tolerance and improved balance.  Patient to discharge at an ambulatory level Supervision due to cognitive deficits. Patient's care partner is independent to provide the necessary cognitive assistance at discharge.  Reasons goals not met: Pt has met all long-term goals.  Recommendation:  Patient will not require ongoing skilled PT services upon discharge as pt is at a Mod I level with all functional mobility and only exhibits cognitive deficits at this time.  Equipment: No equipment provided  Reasons for discharge: treatment goals met and discharge from hospital  Patient/family agrees with progress made and goals achieved: Yes  PT Discharge Precautions/Restrictions Precautions Precautions: Fall Precaution Comments: Rt crani Restrictions Weight Bearing Restrictions: No Cognition Overall Cognitive Status: Impaired/Different from baseline Arousal/Alertness: Awake/alert Orientation Level: Oriented  X4 Attention: Sustained Sustained Attention: Impaired Memory: Impaired Memory Impairment: Storage deficit;Decreased recall of new information Awareness: Impaired Awareness Impairment: Intellectual impairment Problem Solving: Impaired Behaviors: Impulsive;Restless Safety/Judgment: Impaired Sensation Sensation Light Touch: Appears Intact Proprioception: Appears Intact Coordination Gross Motor Movements are Fluid and Coordinated: Yes Motor  Motor Motor: Within Functional Limits  Balance Balance Balance Assessed: Yes Dynamic Sitting Balance Dynamic Sitting - Balance Support: Feet supported Dynamic Sitting - Level of Assistance: 7: Independent Dynamic Standing Balance Dynamic Standing - Balance Support: No upper extremity supported;During functional activity Dynamic Standing - Level of Assistance: 6: Modified independent (Device/Increase time) Extremity Assessment   RLE Assessment RLE Assessment: Within Functional Limits LLE Assessment LLE Assessment: Within Functional Limits   See Function Navigator for Current Functional Status.  Excell Seltzer, PT, DPT  04/23/2017, 3:54 PM

## 2017-04-23 NOTE — Progress Notes (Signed)
Speech Language Pathology Daily Session Note  Patient Details  Name: Thomas Mcfarland MRN: [id number] Date of Birth: 1976-04-13  Today's Date: 04/23/2017 SLP Individual Time: 0730-0800 SLP Individual Time Calculation (min): 30 min  Short Term Goals: Week 1: SLP Short Term Goal 1 (Week 1): Patient will consume current diet of regular textures with thin liquids without overt s/s of aspiration with Mod I for use of swallowing compenstory strategies. SLP Short Term Goal 2 (Week 1): Patient will demonstrate functional problem solving for basic and familiar tasks with Mod A verbal cues.  SLP Short Term Goal 3 (Week 1): Patient will demonstrate sustained attention to functional tasks for 10 minutes with Mod A verbal cues for redirection.  SLP Short Term Goal 4 (Week 1): Patient will identify 1 physical and 1 cognitive deficit with Mod A multimodal cues.  SLP Short Term Goal 5 (Week 1): Patient will utilize external aids to recall daily information with Mod A verbal cues.   Skilled Therapeutic Interventions: Skilled treatment session focused on dysphagia and cognitive goals. Upon arrival, patient was asleep while in enclosure bed but was easily awakened. SLP facilitated session by providing Min A verbal cues for problem solving during basic self-care tasks. Patient was able to demonstrate alternating attention between self-feeding and functional conversation for ~20 minutes with Mod I. Patient was much more engaged and appropriate this session and consumed meal of regular textures with thin liquids without overt s/s of aspiration. Patient left upright in bed with RN present. Continue with current plan of care.      Function:  Eating Eating   Modified Consistency Diet: No Eating Assist Level: Swallowing techniques: self managed(needs encouragement for intake )           Cognition Comprehension Comprehension assist level: Understands complex 90% of the time/cues 10% of the time  Expression    Expression assist level: Expresses basic 90% of the time/requires cueing < 10% of the time.  Social Interaction Social Interaction assist level: Interacts appropriately 75 - 89% of the time - Needs redirection for appropriate language or to initiate interaction.  Problem Solving Problem solving assist level: Solves basic 75 - 89% of the time/requires cueing 10 - 24% of the time  Memory Memory assist level: Recognizes or recalls 50 - 74% of the time/requires cueing 25 - 49% of the time    Pain Pain Assessment Pain Score: 10-Worst pain ever Pain Type: Acute pain Pain Location: Head Pain Descriptors / Indicators: Headache Pain Intervention(s): Medication (See eMAR)  Therapy/Group: Individual Therapy  Haydan Mansouri 04/23/2017, 10:48 AM

## 2017-04-23 NOTE — Progress Notes (Addendum)
Occupational Therapy Discharge Summary  Patient Details  Name: Thomas Mcfarland MRN: [id number] Date of Birth: [birth year]   Today's Date: 04/23/2017 OT Individual Time: 9562-1308 OT Individual Time Calculation (min): 72 min    Patient has met 9 of 9 long term goals due to improved activity tolerance, improved balance, postural control, ability to compensate for deficits, improved attention and improved coordination.  Patient to discharge at overall Supervision level.  Patient's family has been trained to provide the necessary cognitive assistance at discharge.    All goals met.   Recommendation:  Patient lacks insurance and therefore does not have access to f/u OT  Equipment: No equipment provided Already has shower seat  Reasons for discharge: treatment goals met  Patient/family agrees with progress made and goals achieved: Yes   Skilled Therapeutic Interventions/Progress Updates:    Tx focus on balance, IADL retraining, attention, and d/c planning.   Pt greeted asleep in enclosure bed, easily woken. His son was was present in nearby recliner. Pt declined bathing/dressing tasks, reported he had already had 2 showers last night, confirmed by son. Pt ambulated to toilet, completed toileting with distant supervision, then ambulated back to sink and initiated oral care. Afterwards he ambulated to therapy apartment. Tried to have him engage in higher level cooking task, involving stove/oven with pt adamantly refusing. Pt reports going to [employer] for meals and never cooking. Also reports he has no IADL responsibilities. This is shared between neighbors/parents. Pt refusing to engage in multiple IADL tasks, eventually agreeable to assist with swapping out his enclosure bed for standard bed. He made the bed with setup of linen, ambulating around bed and reaching outside of base of support. Pt stooping to moving items and also to plug bed into outlet without LOB. Afterwards he ambulated to RN  station, made coffee for his son with min cues for mgt of coffee maker. Once pt returned to room, he was agreeable to tune/play guitar. Sustained attention/dual task demands with pt playing familiar songs/tuning while interacting with son. Educated pt regarding safety measures to take at home, including having parents present during functional activities and abstaining from motorcycle riding until MD clearance. Pt was left with son at session exit. All needs within reach.   OT Discharge Precautions/Restrictions  Precautions Precautions: Fall Precaution Comments: Rt crani Restrictions Weight Bearing Restrictions: No Pain Pt c/o chronic headaches    ADL ADL ADL Comments: Please see functional navigator for ADL status Vision Patient Visual Report: No change from baseline Vision Assessment?: No apparent visual deficits Perception  Perception: Within Functional Limits Praxis Praxis: Impaired Praxis Impairment Details: Perseveration Cognition Overall Cognitive Status: Impaired/Different from baseline Arousal/Alertness: Awake/alert Orientation Level: Oriented X4 Attention: Sustained Sustained Attention: Impaired Memory: Impaired Awareness: Impaired Problem Solving: Impaired Behaviors: Impulsive;Restless Safety/Judgment: Impaired [employer] Scales of Cognitive Functioning: Confused/appropriate Sensation Sensation Light Touch: Appears Intact Proprioception: Appears Intact Coordination Gross Motor Movements are Fluid and Coordinated: Yes Fine Motor Movements are Fluid and Coordinated: Yes Motor  Motor Motor: Within Functional Limits Mobility  Transfers Transfers: Sit to Stand;Stand to Sit Sit to Stand: 5: Supervision;From toilet Stand to Sit: To toilet;5: Supervision  Trunk/Postural Assessment  Cervical Assessment Cervical Assessment: Within Functional Limits Thoracic Assessment Thoracic Assessment: Within Functional Limits Lumbar Assessment Lumbar Assessment:  Within Functional Limits Postural Control Postural Control: Within Functional Limits  Balance Balance Balance Assessed: Yes Dynamic Sitting Balance Dynamic Sitting - Balance Support: Feet supported Dynamic Sitting - Level of Assistance: 5: Stand by assistance(bathing/dressing) Dynamic Standing Balance Dynamic  Standing - Balance Support: No upper extremity supported;During functional activity(bathing/dressing) Dynamic Standing - Level of Assistance: 5: Stand by assistance Extremity/Trunk Assessment RUE Assessment RUE Assessment: Within Functional Limits LUE Assessment LUE Assessment: Within Functional Limits   See Function Navigator for Current Functional Status.  Keondre Markson A Shawnetta Lein 04/23/2017, 8:15 PM

## 2017-04-23 NOTE — Progress Notes (Signed)
[facility] MEDIC[facility] & REHABILITATION     PROGRESS NOTE  Subjective/Complaints: Pt seen laying in bed this AM.  He slept well overnight and notes improvement in cognition.  ROS: Appears to deny CP, SOB, N/V/D.  Objective: Vital Signs: Blood pressure 111/75, pulse 60, temperature (!) 97.5 F (36.4 C), temperature source Oral, resp. rate 18, height 5\' 8"  (1.727 m), weight 72.9 kg (160 lb 11.5 oz), SpO2 98 %. No results found. No results for input(s): WBC, HGB, HCT, PLT in the last 72 hours. Recent Labs    04/23/17 0532  NA 134*  K 3.6  CL 102  GLUCOSE 105*  BUN 20  CREATININE 0.85  CALCIUM 10.1   CBG (last 3)  No results for input(s): GLUCAP in the last 72 hours.  Wt Readings from Last 3 Encounters:  04/19/17 72.9 kg (160 lb 11.5 oz)  04/16/17 78.1 kg (172 lb 2.9 oz)    Physical Exam:  Constitutional: He appears well-developed and well-nourished. NAD. HENT: Normocephalic and atraumatic.  Eyes:  keeps eyes closed.  Cardiovascular: RRR. No JVD  Respiratory:CTA Bilaterally. Normal effort   GI: Bowel sounds are normal. He exhibits no distension.  Musculoskeletal: He exhibits no edema. No tenderness Neurological: Alert and Oriented x3 Moving all extremities freely Skin: Healing abrasions  Psychiatric:  Flat   Assessment/Plan: 1.  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 Physiatrist is providing close team supervision and 24 hour management of active medical problems listed below. Physiatrist and rehab team continue to assess barriers to discharge/monitor patient progress toward functional and medical goals.  Function:  Bathing Bathing position   Position: Shower(Pt able to reach areas and brush over them with hand ,but refuses to use soap/wash cloths)  Bathing parts Body parts bathed by patient: Right arm, Left arm, Chest,  Abdomen, Front perineal area, Buttocks, Right upper leg, Left upper leg, Right lower leg, Left lower leg    Bathing assist Assist Level: Touching or steadying assistance(Pt > 75%)      Upper Body Dressing/Undressing Upper body dressing   What is the patient wearing?: Pull over shirt/dress     Pull over shirt/dress - Perfomed by patient: Thread/unthread right sleeve, Thread/unthread left sleeve, Put head through opening, Pull shirt over trunk          Upper body assist Assist Level: Touching or steadying assistance(Pt > 75%)(standing)      Lower Body Dressing/Undressing Lower body dressing   What is the patient wearing?: Pants, Non-skid slipper socks, Shoes     Pants- Performed by patient: Thread/unthread right pants leg, Thread/unthread left pants leg, Pull pants up/down   Non-skid slipper socks- Performed by patient: Don/doff right sock, Don/doff left sock Non-skid slipper socks- Performed by helper: Don/doff right sock, Don/doff left sock     Shoes - Performed by patient: Don/doff right shoe, Don/doff left shoe, Fasten right, Fasten left            Lower body assist Assist for lower body dressing: Touching or steadying assistance (Pt > 75%)      Toileting Toileting   Toileting steps completed by patient: Adjust clothing prior to toileting, Performs perineal hygiene, Adjust clothing after toileting      Toileting assist Assist level: Supervision or verbal cues   Transfers Chair/bed transfer   Chair/bed transfer method: Ambulatory Chair/bed transfer assist level: Touching or steadying assistance (Pt > 75%)  Locomotion Ambulation     Max distance: 150 Assist level: Touching or steadying assistance (Pt > 75%)   Wheelchair          Cognition Comprehension Comprehension assist level: Understands complex 90% of the time/cues 10% of the time  Expression Expression assist level: Expresses basic 90% of the time/requires cueing < 10% of the time.  Social  Interaction Social Interaction assist level: Interacts appropriately 50 - 74% of the time - May be physically or verbally inappropriate.  Problem Solving Problem solving assist level: Solves basic 50 - 74% of the time/requires cueing 25 - 49% of the time  Memory Memory assist level: Recognizes or recalls 25 - 49% of the time/requires cueing 50 - 75% of the time   Medical Problem List and Plan: 1.  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  DVT Prophylaxis/Anticoagulation: Mechanical:  Antiembolism stockings, knee (TED hose) Bilateral lower extremities Sequential compression devices, below knee Bilateral lower extremities 3. Pain Management: D/Ced fentanyl and ultram (seizure in ED).    Added oxycodone 10- 15 mg prn for HA.              Topiramate 25 mg twice daily for headaches started on 12/21. Patient apparently has been on this in the past, related to his initial brain injury a few years ago 4. Mood: LCSW to follow for evaluation and support.  5. Neuropsych: This patient is not capable of making decisions on his own behalf.     Will d/c Canopy bed due to improvement in cognition 6. Skin/Wound Care: routine pressure relief measures. 7. Fluids/Electrolytes/Nutrition: On regular diet thin liquids currently    -Encourage p.o. 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 9. Acute on chronic HA: History of migraines per mother. Monitor for now. Work on sleep wake hygiene   See #3 10. Sleep disturbance: Continue Seroquel 100 mg at 9 PM each night   Melatonin added 12/22, increased on 12/23   Sleep hygiene.  Avoid excessive napping during the day   Continue sleep chart, needs to be updated    Improving 11. Hypokalemia: Resolved   K+ 3.6 on 12/24   Cont to monitor 12. Hyperglycemia: Resolved   Likely stress induced   Cont to monitor 13. ABLA   Hb 10.8 on 12/21   Labs pending   Cont to monitor 14. Hyponatremia   Na+ 134  on 12/24   Cont to monitor  LOS (Days) 4 A FACE TO FACE EVALUATION WAS PERFORMED  Thomas Mcfarland Karis JubaAnil Marisue Canion, MD 04/23/2017 10:40 AM
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
## 2017-04-23 NOTE — Progress Notes (Signed)
Social Work Patient ID: Thomas BlakeShawn Mondry, male   DOB: 1976/04/10, 41 y.o.   MRN: [id number]   Review team conference with patient and his father.  Both are agreeable with plan for discharge tomorrow morning.  Have discussed follow-up home health services that [facility].  Father confirms that family prepared to provide 24/7 supervision at home.  Pleased with progress and ready for discharge.  Shaughnessy Gethers, LCSW

## 2017-04-23 NOTE — Progress Notes (Signed)
Speech Language Pathology Daily Session Note  Patient Details  Name: Thomas Mcfarland MRN: [id number] Date of Birth: [birth year]  Today's Date: 04/23/2017 SLP Individual Time: 1100-1130 SLP Individual Time Calculation (min): 30 min  Short Term Goals: Week 1: SLP Short Term Goal 1 (Week 1): Patient will consume current diet of regular textures with thin liquids without overt s/s of aspiration with Mod I for use of swallowing compenstory strategies. SLP Short Term Goal 2 (Week 1): Patient will demonstrate functional problem solving for basic and familiar tasks with Mod A verbal cues.  SLP Short Term Goal 3 (Week 1): Patient will demonstrate sustained attention to functional tasks for 10 minutes with Mod A verbal cues for redirection.  SLP Short Term Goal 4 (Week 1): Patient will identify 1 physical and 1 cognitive deficit with Mod A multimodal cues.  SLP Short Term Goal 5 (Week 1): Patient will utilize external aids to recall daily information with Mod A verbal cues.   Skilled Therapeutic Interventions: Skilled treatment session focused on cognition goals. SLP facilitated session by administering MOCA version 8.2. Pt obtained score of 21 out of 30 (n=>26). Of note, this is improvement over previous score of 14 out of 30 on 04/20/17. Pt continues to demonstrate moderate impairments impacting STM, intellectual awareness and problem solving. Education provided. Pt left upright in bed with son present and all needs within reach. Continue per current plan of care.      Function:  Eating Eating   Modified Consistency Diet: No Eating Assist Level: Swallowing techniques: self managed(needs encouragement for intake )           Cognition Comprehension Comprehension assist level: Understands complex 90% of the time/cues 10% of the time  Expression   Expression assist level: Expresses basic 90% of the time/requires cueing < 10% of the time.  Social Interaction Social Interaction assist level: Interacts  appropriately 75 - 89% of the time - Needs redirection for appropriate language or to initiate interaction.  Problem Solving Problem solving assist level: Solves basic 75 - 89% of the time/requires cueing 10 - 24% of the time  Memory Memory assist level: Recognizes or recalls 50 - 74% of the time/requires cueing 25 - 49% of the time    Pain Pain Assessment Pain Score: 10-Worst pain ever  Therapy/Group: Individual Therapy  [name] 04/23/2017, 12:06 PM

## 2017-04-23 NOTE — Discharge Instructions (Signed)
Inpatient Rehab Discharge Instructions  Thomas BlakeShawn Mcfarland Discharge date and time: No discharge date for patient encounter.   Activities/Precautions/ Functional Status: Activity: activity as tolerated Diet: regular diet Wound Care: keep wound clean and dry Functional status:  ___ No restrictions     ___ Walk up steps independently ___ 24/7 supervision/assistance   ___ Walk up steps with assistance ___ Intermittent supervision/assistance  ___ Bathe/dress independently ___ Walk with walker     _x__ Bathe/dress with assistance ___ Walk Independently    ___ Shower independently ___ Walk with assistance    ___ Shower with assistance ___ No alcohol     ___ Return to work/school ________   COMMUNITY REFERRALS UPON DISCHARGE:    Home Health:   PT     OT    ST                     Agency:  [facility] Phone: [phone]     Special Instructions: No driving  Follow-up outpatient Dr. Marikay Alaravid Jones for removal of staples   My questions have been answered and I understand these instructions. I will adhere to these goals and the provided educational materials after my discharge from the hospital.  Patient/Caregiver Signature _______________________________ Date __________  Clinician Signature _______________________________________ Date __________  Please bring this form and your medication list with you to all your follow-up doctor's appointments.

## 2017-04-23 NOTE — Progress Notes (Signed)
Discharge instructions given to father by Deatra Inaan Angiulli. PA. Verbal understanding given. Pt to discharge to home 12-25

## 2017-04-23 NOTE — Plan of Care (Signed)
9/9 LTGs achieved 04/23/17

## 2017-04-24 NOTE — Progress Notes (Deleted)
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
## 2017-04-24 NOTE — Progress Notes (Signed)
Hypoglycemic Event  CBG: 50  Treatment: 15 GM carbohydrate snack  Symptoms: None  Follow-up CBG: Time:0735 CBG Result:119  Possible Reasons for Event: Unknown  Comments/MD notified:yes    [name]  [name]

## 2017-04-26 NOTE — Progress Notes (Signed)
Speech Language Pathology Discharge Summary  Patient Details  Name: Thomas Mcfarland MRN: [id number] Date of Birth: 1975-07-23   Patient has met 4 of 5 long term goals.  Patient to discharge at overall Min;Mod level.   Reasons goals not met: Memory goal not met due to short length of stay     Clinical Impression/Discharge Summary: Patient has made excellent gains and has met 4 of 5 LTG's this admission. Currently, patient demonstrates behaviors consistent with a Rancho Level VII and requires overall Min-Mod A verbal cues to complete functional and familiar tasks safely in regards to problem solving, awareness, recall and attention. Patient education is complete and patient will discharge home with 24 hour supervision from family. Patient would benefit from f/u SLP services to maximize his cognitive function and overall functional independence in order to reduce caregiver burden.   Care Partner:  Caregiver Able to Provide Assistance: Yes  Type of Caregiver Assistance: Cognitive;Physical  Recommendation:  24 hour supervision/assistance;Home Health SLP  Rationale for SLP Follow Up: Reduce caregiver burden;Maximize cognitive function and independence   Equipment: N/A   Reasons for discharge: Treatment goals met;Discharged from hospital   Patient/Family Agrees with Progress Made and Goals Achieved: Yes     [place], [place] 04/26/2017, 8:39 AM
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
## 2017-05-01 NOTE — Progress Notes (Signed)
Social Work  Discharge Note  The overall goal for the admission was met for:   Discharge location: Yes - home with parents who will provide 24/7 supervision  Length of Stay: Yes - 5 days  Discharge activity level: Yes - supervision  Home/community participation: Yes  Services provided included: MD, RD, PT, OT, SLP, RN, TR, Pharmacy and SW  Financial Services: Other: uninsured  Follow-up services arranged: Home Health: PT, OT, ST via [facility] and Patient/Family has no preference for HH/DME agencies  Comments (or additional information):  Patient/Family verbalized understanding of follow-up arrangements: Yes  Individual responsible for coordination of the follow-up plan: parents  Confirmed correct DME delivered: NA - no needs    Nawaf Strange

## 2017-05-20 ENCOUNTER — Other Ambulatory Visit: Payer: Self-pay | Admitting: Physical Medicine & Rehabilitation

## 2017-05-28 ENCOUNTER — Encounter: Payer: Self-pay | Attending: Physical Medicine & Rehabilitation | Admitting: Physical Medicine & Rehabilitation

## 2018-09-30 DEATH — deceased
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
## 2019-04-20 IMAGING — CT CT HEAD W/O CM
3 of 5 series · 14 of 47 positions shown, 16 images · non-contrast
Comparison: CT from yesterday

CLINICAL DATA: Postop for epidural hematoma evacuation.

EXAM:
CT HEAD WITHOUT CONTRAST
TECHNIQUE: Contiguous axial images were obtained from the base of the skull
through the vertex without intravenous contrast.

[Series 5: cor soft · coronal · 0.35mm/px · 3 of 70 slices shown]
[im 24/70  brain]
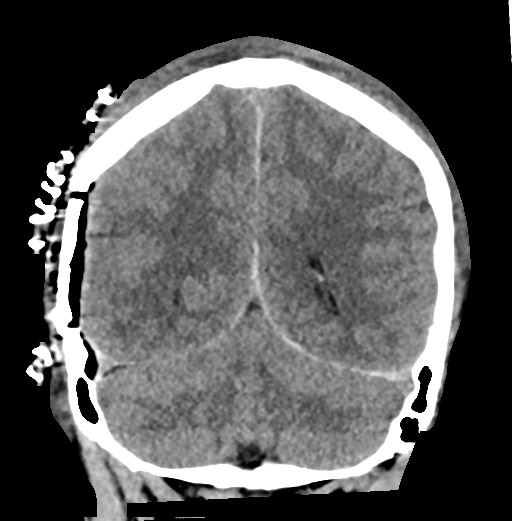
[im 31/70  brain]
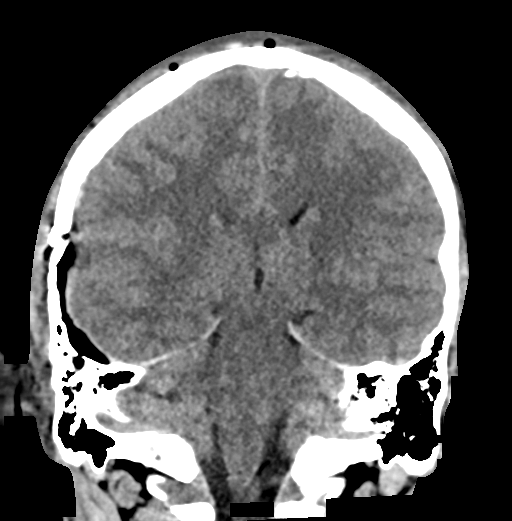
[im 39/70  brain]
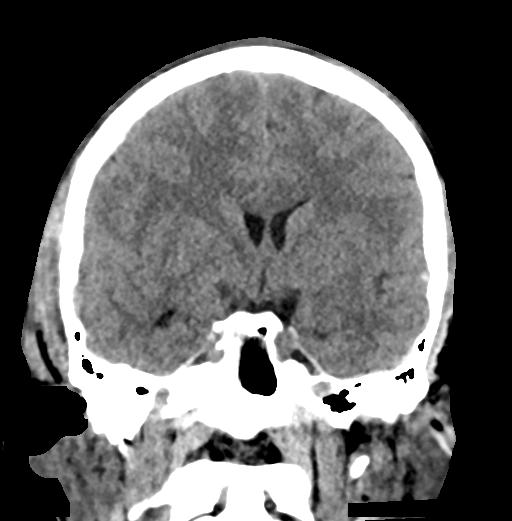

[Series 6: sag soft · sagittal · 0.35mm/px · 3 of 59 slices shown (1 of 2)]
[im 20/59  brain]
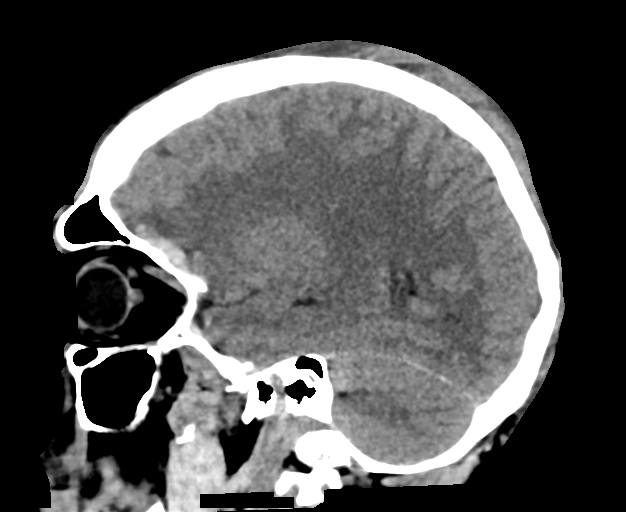
[im 30/59  brain]
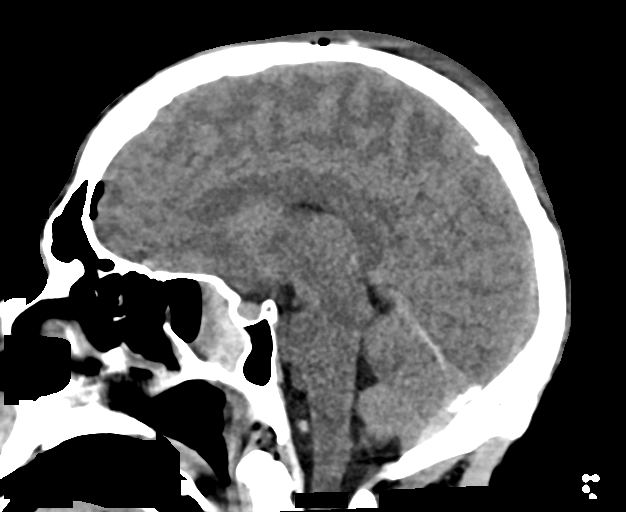
[im 39/59  brain]
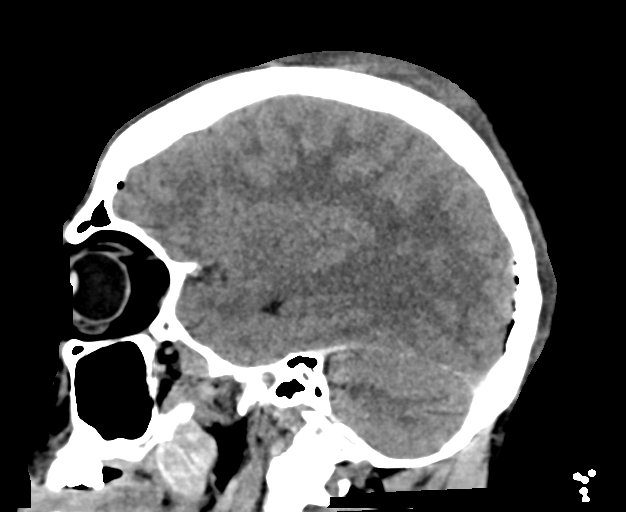

[Series 7: sag soft · axial · 0.35mm/px · z∈[-121,+22]mm · 8 of 62 slices shown, 10 images (2 of 2)]
[im 7/62  brain]
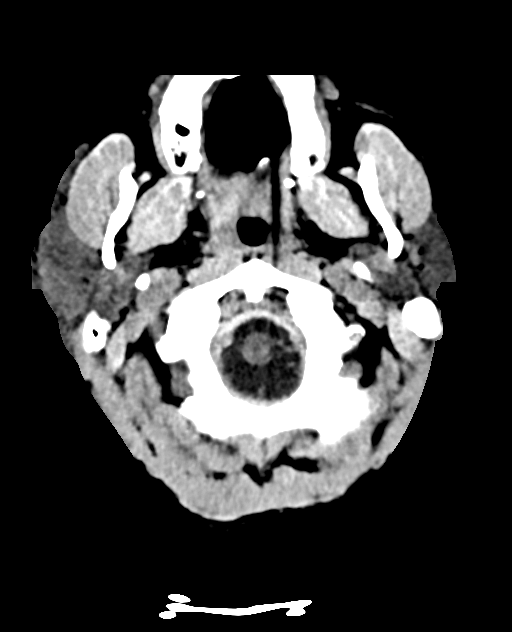
[im 7/62  bone]
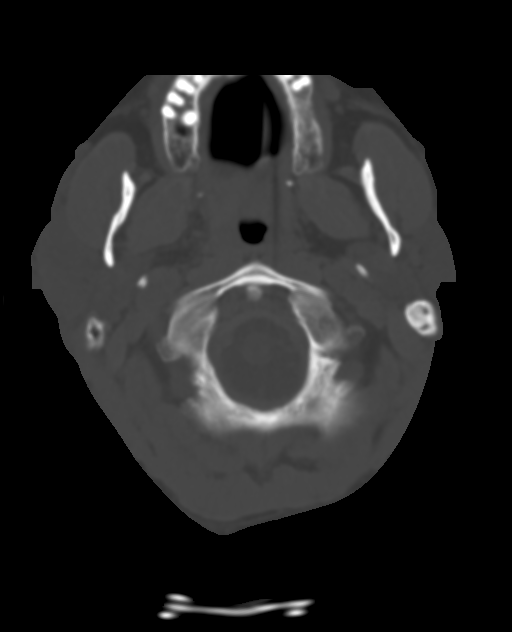
[im 14/62  brain]
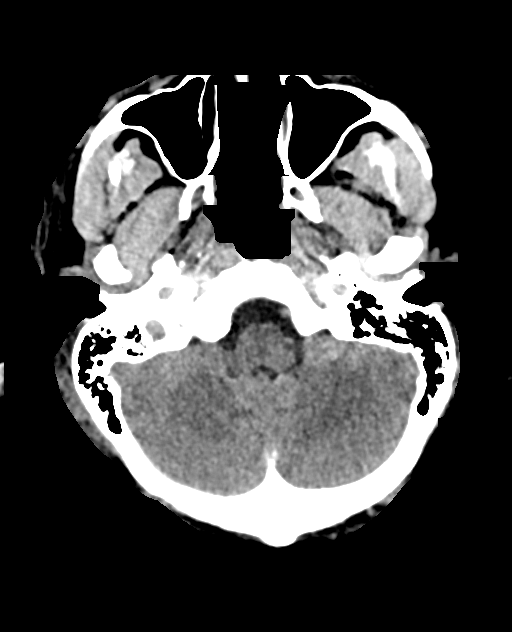
[im 21/62  brain]
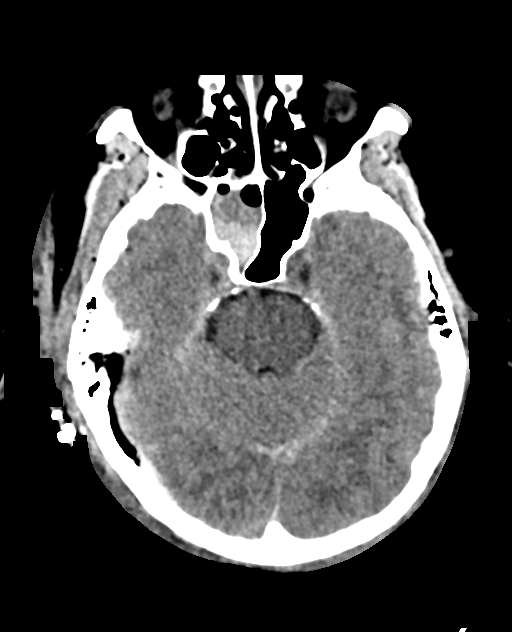
[im 28/62  brain]
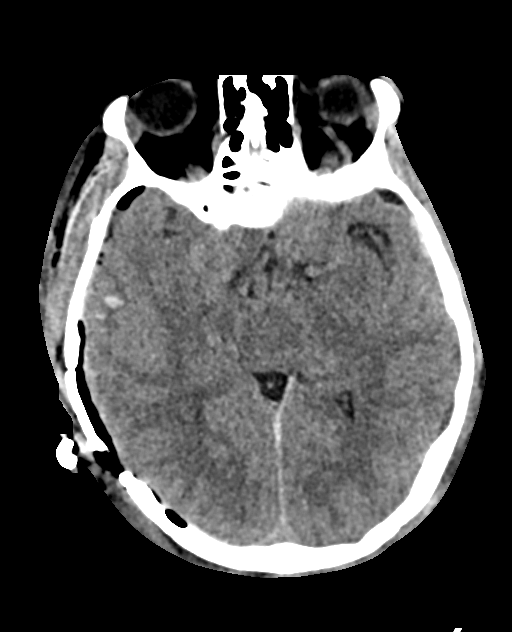
[im 34/62  brain]
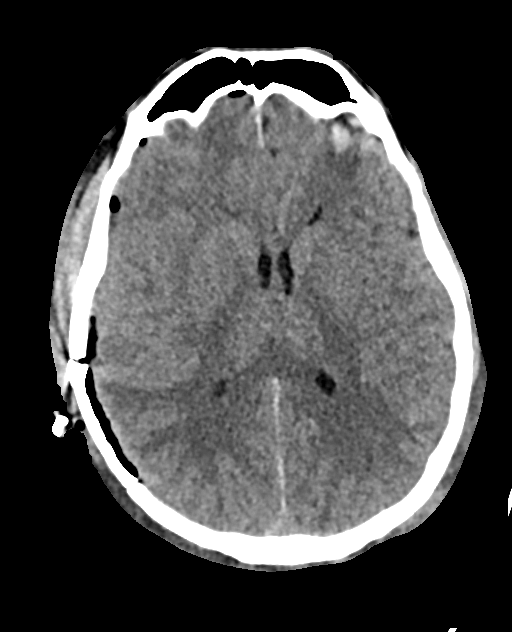
[im 34/62  bone]
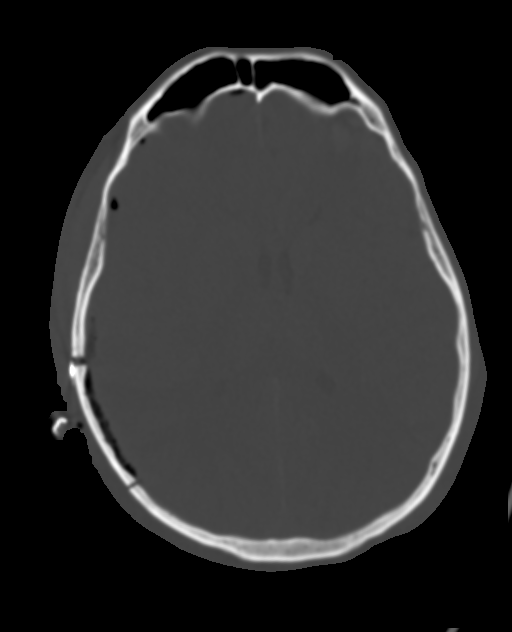
[im 41/62  brain]
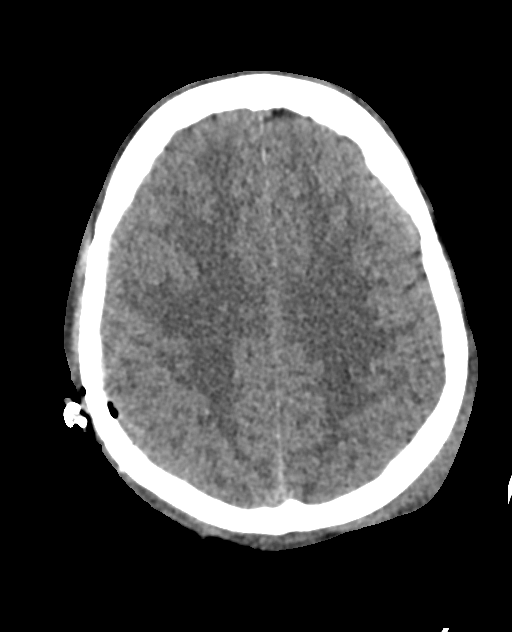
[im 48/62  brain]
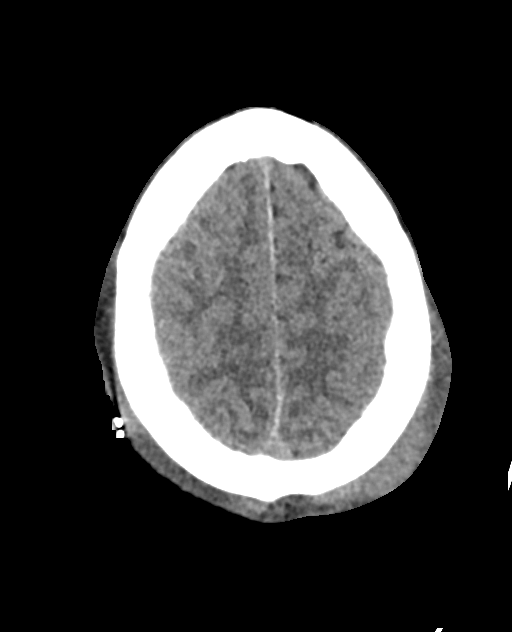
[im 55/62  brain]
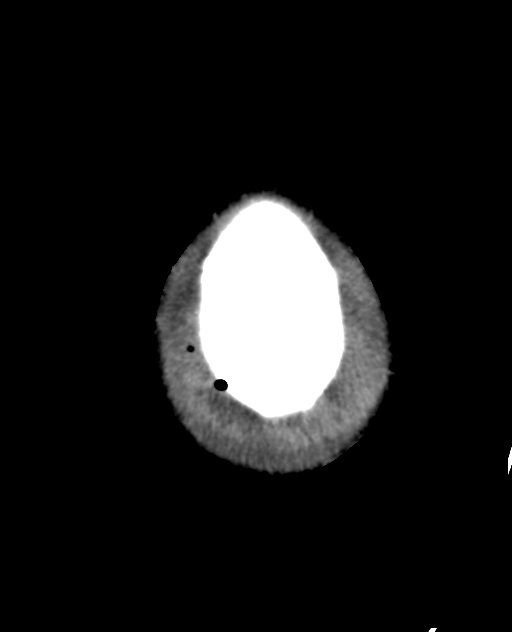

[14 of 47 positions shown; findings below may reference images not displayed]

FINDINGS: Brain: Interval evacuation of right-sided epidural hematoma.
Hemostatic material seen along the right cerebral convexity and
tegmen mastoideum. Midline shift is improved to 2 mm. Hemorrhagic
contusion seen in the inferior left frontal lobe and superficial
right temporal lobes. No evidence of infarct. No hydrocephalus.

Vascular: Negative

Skull: Right temporal bone fracture involving the mastoid with
pneumocephalus and hemotympanum. Unremarkable right-sided
craniotomy.

Sinuses/Orbits: High-density material within the right sphenoid
sinus without visible superimposed fracture. Nasal septal defect.

Other: Generalized scalp edema.
IMPRESSION: 1. Evacuated right epidural hematoma with improved midline shift to
2 mm.
2. Hemorrhagic contusions in the inferior left frontal and
superficial right temporal lobes.
3. Right temporal bone fracture with hemotympanum.
4. Hemosinus or secretions in the right sphenoid .
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
